# Patient Record
Sex: Female | Born: 1960
Health system: Southern US, Community
[De-identification: ages and names within clinical notes are randomized; demographics above are authoritative.]

## PROBLEM LIST (undated history)

## (undated) DIAGNOSIS — E785 Hyperlipidemia, unspecified: Secondary | ICD-10-CM

## (undated) DIAGNOSIS — Z800 Family history of malignant neoplasm of digestive organs: Secondary | ICD-10-CM

## (undated) DIAGNOSIS — Z808 Family history of malignant neoplasm of other organs or systems: Secondary | ICD-10-CM

## (undated) DIAGNOSIS — Z8041 Family history of malignant neoplasm of ovary: Secondary | ICD-10-CM

## (undated) DIAGNOSIS — Z801 Family history of malignant neoplasm of trachea, bronchus and lung: Secondary | ICD-10-CM

## (undated) DIAGNOSIS — Z86006 Personal history of melanoma in-situ: Secondary | ICD-10-CM

## (undated) HISTORY — DX: Hyperlipidemia, unspecified: E78.5

## (undated) HISTORY — DX: Family history of malignant neoplasm of digestive organs: Z80.0

## (undated) HISTORY — DX: Family history of malignant neoplasm of trachea, bronchus and lung: Z80.1

## (undated) HISTORY — DX: Personal history of melanoma in-situ: Z86.006

## (undated) HISTORY — DX: Family history of malignant neoplasm of other organs or systems: Z80.8

## (undated) HISTORY — PX: LAPAROSCOPIC SALPINGO OOPHERECTOMY: SHX5927

## (undated) HISTORY — DX: Family history of malignant neoplasm of ovary: Z80.41

## (undated) HISTORY — PX: ABDOMINAL HYSTERECTOMY: SHX81

---

## 1998-07-13 ENCOUNTER — Ambulatory Visit (HOSPITAL_COMMUNITY): Admission: RE | Admit: 1998-07-13 | Discharge: 1998-07-13 | Payer: Self-pay | Admitting: Endocrinology

## 1998-07-14 ENCOUNTER — Encounter: Payer: Self-pay | Admitting: Endocrinology

## 2000-03-07 ENCOUNTER — Other Ambulatory Visit: Admission: RE | Admit: 2000-03-07 | Discharge: 2000-03-07 | Payer: Self-pay | Admitting: *Deleted

## 2001-04-10 ENCOUNTER — Other Ambulatory Visit: Admission: RE | Admit: 2001-04-10 | Discharge: 2001-04-10 | Payer: Self-pay | Admitting: *Deleted

## 2005-01-27 ENCOUNTER — Encounter: Admission: RE | Admit: 2005-01-27 | Discharge: 2005-01-27 | Payer: Self-pay | Admitting: *Deleted

## 2005-02-18 ENCOUNTER — Encounter (INDEPENDENT_AMBULATORY_CARE_PROVIDER_SITE_OTHER): Payer: Self-pay | Admitting: Specialist

## 2005-02-18 ENCOUNTER — Encounter (INDEPENDENT_AMBULATORY_CARE_PROVIDER_SITE_OTHER): Payer: Self-pay | Admitting: *Deleted

## 2005-02-18 ENCOUNTER — Inpatient Hospital Stay (HOSPITAL_COMMUNITY): Admission: RE | Admit: 2005-02-18 | Discharge: 2005-02-21 | Payer: Self-pay | Admitting: *Deleted

## 2005-04-15 ENCOUNTER — Ambulatory Visit (HOSPITAL_COMMUNITY): Admission: RE | Admit: 2005-04-15 | Discharge: 2005-04-15 | Payer: Self-pay | Admitting: Urology

## 2008-08-26 ENCOUNTER — Ambulatory Visit: Payer: Self-pay | Admitting: Family Medicine

## 2008-08-26 ENCOUNTER — Encounter: Payer: Self-pay | Admitting: Sports Medicine

## 2008-08-26 DIAGNOSIS — M7750 Other enthesopathy of unspecified foot: Secondary | ICD-10-CM | POA: Insufficient documentation

## 2008-08-26 DIAGNOSIS — M2140 Flat foot [pes planus] (acquired), unspecified foot: Secondary | ICD-10-CM | POA: Insufficient documentation

## 2008-08-26 DIAGNOSIS — G5750 Tarsal tunnel syndrome, unspecified lower limb: Secondary | ICD-10-CM | POA: Insufficient documentation

## 2008-08-26 DIAGNOSIS — M21619 Bunion of unspecified foot: Secondary | ICD-10-CM | POA: Insufficient documentation

## 2008-08-26 DIAGNOSIS — M722 Plantar fascial fibromatosis: Secondary | ICD-10-CM | POA: Insufficient documentation

## 2008-08-29 ENCOUNTER — Encounter: Payer: Self-pay | Admitting: Sports Medicine

## 2008-09-16 ENCOUNTER — Ambulatory Visit: Payer: Self-pay | Admitting: Sports Medicine

## 2008-12-09 ENCOUNTER — Ambulatory Visit: Payer: Self-pay | Admitting: Family Medicine

## 2010-05-25 NOTE — Op Note (Signed)
NAME:  Amanda Carney, Amanda Carney               ACCOUNT NO.:  [id number]   MEDICAL RECORD NO.:  [id number]          PATIENT TYPE:  INP   LOCATION:  9373                          FACILITY:  [facility]   PHYSICIAN:  Pershing Cox, M.D.DATE OF BIRTH:  1960-01-25   DATE OF PROCEDURE:  02/18/2005  DATE OF DISCHARGE:                                 OPERATIVE REPORT   PREOPERATIVE DIAGNOSIS:  Large complex ovarian cyst in a patient with a  history of ovarian transposition and pelvic radiation therapy for treatment  of a squamous carcinoma of the cervix.   POSTOPERATIVE DIAGNOSIS:  Benign serous cyst of both ovaries.  Extensive  adhesive disease of the left ovary to the retroperitoneal structures and  linear ureterotomy the left ureter.   SURGEON:  Pershing Cox, M.D.   ASSISTANT:  Richardean Sale, M.D.   INTRAOPERATIVE UROLOGY CONSULT:  Leighton Roach McDiarmid, M.D.   INDICATIONS FOR PROCEDURE:  This patient is 50 years old.  She has a family  history of ovarian cancer and has been followed in my office with sonograms  because of her history.  She was seen this year and had a sonogram which  showed complex ovarian cyst.  The sonogram was repeated, and the cyst had  persisted.  A decision was made to remove the cyst by exploratory  laparotomy.  What is important to know is that she had a history of cervical  cancer and underwent radical hysterectomy in 1993.  At that time, both of  her ovaries were removed out of the pelvis, and she subsequently received a  4500 rads of whole pelvic radiation therapy with four field technique.   OPERATIVE FINDINGS:  The omentum was adherent to the previous midline  incision but it was not densely adherent.  There was no evidence of small  bowel adhesive disease or small bowel damage in the pelvis.  The left ureter  was approximately 3 mm to 4 mm in size.  This was the size of the ureter,  virtually the length of its transverse through the pelvis.  The 8 cm ovarian  cyst was arising in the mesentery of the descending colon.  The ovary was  adherent to the psoas muscle, the left external iliac artery and vein into  the ureter.  There were absolutely no surgical planes seen between the ovary  and the structures.  Extensive adhesive disease in this area required a  careful dissection which lasted approximately 2 hours just in order to be  certain that we did not injure the external iliac artery, vein or ureter.  During the dissection, the ovary was ruptured, and then we used the ovary  with a clamp to carefully see the extent of the ovary and its adhesions as  we conducted our dissection from the lower structures.  The ovary was sent  for frozen section and returned as a serous cyst of the ovary.  During the  dissection, there was no clear evidence as to where the infundibulopelvic  ligament came into the ovary.  Later in the case inspecting that side of the  pelvis,  I saw a pedicle which seemed to contain a small vascular structure.  It is my strong suspicion that this was filleted over the ovarian cyst and  once the cyst was removed it came back to normal size and was the vascular  pedicle.  At the end of the dissection of the left ovary, I was inspecting  the surfaces and there appeared to be a small injury in the left ureter  along the cervix that had been dissected from the ovary.  This was about 1  cm in length and was sort of a linear or serosal tear.  Because the patient  had previous radiation therapy, I was concerned about this and we  administered indigo carmine and there was evidence of indigo carmine cells.  The urologist was called and later in the case r. McDiarmid came and placed  the stent and sutured over the top of the stent to repair this ureter.  Please refer to his own dictated note for more details.   The right ovary was high, almost beside the right kidney.  It appeared to be  more like a mesenteric inclusion cyst and the adhesions  to this and ovary  were soft and filamentous. The IP ligament was clamped and free tied and  ligated after being cut.  There was no bleeding after this ovary was  removed.  After Dr. McDiarmid finished his repair, we irrigated the  retroperitoneal space and there was a pinpoint hole in the external iliac  vein. This was controlled with two hemoclips and 5 minutes of pressure.  Careful inspection of this area after the dissection shows no evidence of  bleeding.  After the completion of Dr. McDiarmid's TURP, the omentum was  brought down into the base and layered over the site of the ureterotomy and  the dissection.  Prior to this, small bowel was run, and there was no  evidence of adhesive disease.  There was a branch in the omentum which was  suture closed with 0 Vicryl suture in two interrupted stitches.  Again, the  omentum was layered down into the retroperitoneal space.  The site of the  dissection was drained with a JP drain which exited the left lower quadrant.  Please see Dr. McDiarmid's note regarding this drain.   The peritoneal cavity was irrigated with warm saline.  At the beginning of  the procedure, we had instilled 500 mL of warm saline and retracted 400 of  those for peritoneal washings. These were sent as separate specimens at the  beginning of the procedure.  Fascial edges were brought together with 0 PDS  using a looped suture and sewing to the middle of the incision from each  end.  The sutures were tied and then buried with 0 Vicryl suture to keep  them from sticking out.  Skin edges were brought together over these Vicryl  sutures with straight skin staples.   The patient received 3000 mL of crystalloid.  She had 250 mL of urine and  550 mL of blood loss.      Pershing Cox, M.D.  Electronically Signed     MAJ/MEDQ  D:  02/18/2005  T:  02/18/2005  Job:  [id number]   cc:   Richardean Sale, M.D.  Fax: [phone]   Leighton Roach McDiarmid, M.D. Fax: [phone]

## 2010-05-25 NOTE — Op Note (Signed)
NAMEMarland Kitchen  Amanda, Carney               ACCOUNT NO.:  [id number]   MEDICAL RECORD NO.:  [id number]          PATIENT TYPE:  INP   LOCATION:  9399                          FACILITY:  [facility]   PHYSICIAN:  Martina Sinner, MD DATE OF BIRTH:  1960-09-11   DATE OF PROCEDURE:  02/18/2005  DATE OF DISCHARGE:                                 OPERATIVE REPORT   PREOPERATIVE DIAGNOSIS:  Injury left ureter.   POSTOPERATIVE DIAGNOSIS:  Injury left ureter.   OPERATION/PROCEDURE:  1.  Repair of ureterotomy.  2.  Insertion of stent.   SURGEON:  Leighton Roach McDiarmid, M.D.   ASSISTANT:  Pershing Cox, M.D.   INDICATIONS:  Dr. Carey Bullocks consulted me intraoperatively while she was doing  a hysterectomy on a patient who required her ovaries to be removed. She has  had previous pelvic radiation and hysterectomy.  Apparently the ovary was  quite close to the ureter and large retroperitoneal vessels.  She noticed  approximately 1 cm surgical laceration to the left ureter just distal to the  bifurcation of the iliacs in the high left pelvis.  There is no history of  previous renal disease.  She had given indigo carmine intraoperatively and  blue dye could be easily seen exiting the small, short laceration.   DESCRIPTION OF PROCEDURE:  I initially inspected the ureter.  It was well  mobilized.  Vascularization looked good except for short segment near the  laceration.  Having said that, the ureteral lumen was intact and for a  patient who has had previous radiation, it actually looked very healthy.  It  did have peristalsis and some blue dye exiting the short laceration.   Initially I tried to place a glide wire through the opening down into the  bladder but the ureter was quite mobile and of small lumen and because of  the J-shape of the wire, it would not easily pass.  I then asked for a  straight sensor wire which I could easily place through the ureterotomy down  into the bladder.  I could palpate the  wire all the way down and beyond the  ureteral vesical junction on the left side.  I then passed the proximal end  of a 6-French x 24 cm double-J stent, well-lubricated  over the wire into  the bladder.  I could feel the stent within the bladder as well as the  ureter at the level of the ureteral vesical junction.  I also could feel the  Foley balloon.   I then removed the wire and Dr. Carey Bullocks held the stent in place with  [name].  I then placed the wire through one of its side holes and had this  enter and exit through the distal end of the double-J stent.  The wire was  then placed up into the left renal pelvis.  I could feel resistance.  I then  passed the double-J wire fairly easily up into the left renal pelvis.  I was  very happy with the placement of the stent.  The laceration may have been  made a few millimeters  larger with the maneuver due to the memory of the  stent and/or sensory wire.  The stent was laying freely within the ureter.  The ureter was closed with four interrupted 4-0 Vicryl sutures.  There was  no efflux of blue dye throughout the rest of the case into the  retroperitoneum.  A small bleeding point on the external iliac vein was  stopped with direct pressure by Dr. Carey Bullocks.  I placed a 10-French Blake  drain in the retroperitoneum approximately two inches away from the hole.  I  placed omentum in the area of the ureter.  I did not suture in places that  laid in so freely.  I did not want the drain to directly lie over the  repaired ureter.   I inspected the ureter again and saw no other openings.  The ureter was  peristalsing nicely around the stent.  I could palpate the stent all the way  down through the ureteral vesical junction as well as o\in the bladder.  I  could feel it between my fingertips as well as feel the Foley balloon.   A 2-0 silk suture was used to suture in the drain.  The patient will be  followed as per protocol.  The patient could be kept  on ciprofloxacin for at  least a week.  The patient will have an indwelling stent for approximately  six weeks.  She is at some risk of a stricture, especially due to radiation,  but actually the intraoperative findings the ureter looked quite healthy.           ______________________________  Martina Sinner, MD  Electronically Signed     SAM/MEDQ  D:  02/18/2005  T:  02/19/2005  Job:  [id number]

## 2010-05-25 NOTE — H&P (Signed)
NAME:  Amanda Carney, Amanda Carney               ACCOUNT NO.:  [id number]   MEDICAL RECORD NO.:  [id number]          PATIENT TYPE:  INP   LOCATION:  NA                            FACILITY:  [facility]   PHYSICIAN:  Pershing Cox, M.D.DATE OF BIRTH:  Nov 07, 1960   DATE OF ADMISSION:  02/18/2005  DATE OF DISCHARGE:                                HISTORY & PHYSICAL   PREOPERATIVE DIAGNOSIS:  Cystic ovarian masses bilaterally and ovaries which  had been previously transposed out of the pelvis.   HISTORY OF PRESENT ILLNESS:  Amanda Carney is a 50 year old, gravida 0  female who is single.  She works for [employer].  She has had a history  of cervical cancer in the past and underwent radical hysterectomy for this  in the 1993.  At that time both of her ovaries were moved out of the pelvis  and she subsequently received 4500 rads of whole pelvic radiation therapy  for a moderately differentiated tumor.  She has a family history of ovarian  cancer and we have been following cysts and her transposed ovaries for  approximately six months.  Recently when she returned for evaluation, the  cysts were still present.  She was counseled by Dr. Earlene Plater regarding this and  it was recommended that these ovaries be removed.  CA125 is negative.  It is  important to know that her mother is in her third recurrence from ovarian  cancer.  An MRI has confirmed the areas of concern on her ovaries.   ALLERGIES:  None known.   PAST MEDICAL HISTORY:  1.  Stage IA moderately differentiated squamous cell carcinoma of the      cervix.  2.  History of Graves disease.  3.  History of melanoma.   PAST SURGICAL HISTORY:  Radical hysterectomy with appendectomy and ovarian  cystectomy and ovarian transposition in 1993.   MEDICATIONS:  1.  Vivelle-Dot 0.05 mg changed biweekly.  2.  Allegra 60 mg capsule daily.  3.  Black cohosh daily.   SOCIAL HISTORY:  The patient is single.  She works for [employer].  She  exercises and  plays tennis six to seven times a week.  She does not smoke.  She uses alcohol approximately four times a week and caffeine twice daily.  She wears a seat belt and has never been a victim of domestic violence.   FAMILY HISTORY:  The patient's father has had a history of malignancy.  Her  mother is 16 and has recurrent ovarian cancer, cared for by Dr. Marlane Hatcher.  Her mother also has a diagnosis of osteoporosis.  She has two sisters; one  with multiple sclerosis and one who has diabetes, poorly controlled and has  had a myocardial infarction this year.   REVIEW OF SYSTEMS:  The patient's only medical concern is anxiety associated  with this new diagnosis and concern that this may be what her mother has  experienced.  She has had some bloating and abdominal pain but wonders if  this could just be a nervous reaction.   PHYSICAL EXAMINATION:  VITAL SIGNS:  Blood  pressure 102/66, pulse 68 and  regular, weight 131, height 5 feet 2 inches, BMI 23.99.  GENERAL:  Well-developed, well-nourished Caucasian female in no acute  distress.  HEENT:  Normocephalic, atraumatic.  Anicteric.  EOMI.  PERRLA.  NECK:  Supple.  Thyroid is normal to palpation.  Carotid arteries are  without bruit bilaterally.  LUNGS:  Clear to auscultation and percussion.  BACK:  No CVA or spinal tenderness.  BREASTS:  Symmetric bilaterally.  No palpable masses.  CARDIOVASCULAR:  Regular rate and rhythm without murmur.  ABDOMEN:  Laparotomy scar in the midline consistent with previous radical  hysterectomy.  No hepatosplenomegaly.  No hernia.  PELVIC:  Normal external genitalia.  The vulva and Bartholin's glands are  normal.  Cervix and uterus are surgically absent.  The patient's ovaries  cannot be palpated.  NEURO/PSYCHIATRIC:  The patient's mood and affect are mildly anxious but  otherwise normal.  MUSCULOSKELETAL:  Gait and station are normal.  LYMPHATICS:  No supraclavicular, axillary, or inguinal adenopathy.    LABORATORY DATA:  Sonogram and MRI show bilateral cystic masses with __a  few________ septations running through the ovarian masses.  On the left  there is a nodule associated with the cystic area.   ASSESSMENT:  1.  Bilateral cystic masses in a patient with transposed ovaries and a      previous history of pelvic radiation therapy following radical      hysterectomy.  2.  Maternal history of ovarian cancer.   PLAN:  Exploratory laparotomy with bilateral salpingo-oophorectomy.  Frozen  section will be performed.  If malignancy is found, lymph nodes above the  previous area of lymph node dissection will be sampled if possible.      Pershing Cox, M.D.  Electronically Signed     MAJ/MEDQ  D:  02/02/2005  T:  02/02/2005  Job:  [id number]

## 2010-05-25 NOTE — Discharge Summary (Signed)
NAMEMarland Kitchen  Carney, Amanda               ACCOUNT NO.:  [id number]   MEDICAL RECORD NO.:  [id number]          PATIENT TYPE:  INP   LOCATION:  9317                          [facility]ACILITY:  [facility]   PHYSICIAN:  Pershing Cox, M.D.DATE OF BIRTH:  Sep 13, 1960   DATE OF ADMISSION:  02/18/2005  DATE OF DISCHARGE:  02/21/2005                                 DISCHARGE SUMMARY   ADMITTING DIAGNOSES:  1.  Bilateral ovarian cysts and transposed ovaries.  2.  Family history of ovarian cancer.   For details of the patient's admission history and physical please see the  note dated February 18, 2005. Briefly, this 50 year old female has a history  of squamous cell carcinoma of the cervix, underwent radical hysterectomy in  1993 with transposition of both of her ovaries out of the pelvis. She  subsequently received 45,000 rads of pelvic radiation therapy. She has a  family history of ovarian cancer and we have been following the transposed  ovaries with abdominal sonogram. She has began to develop cystic lesions on  her ovaries and presents today for a bilateral salpingo-oophorectomy. Her CA-  125 is not elevated.   HOSPITAL COURSE:  The patient was taken to the operating room on the day of  admission. She underwent exploratory laparotomy with a midline incision  through her previous midline incision for her radical hysterectomy. The  level of adhesive disease was extensive and most important, the left ovary  was densely adherent to the retroperitoneum. All of the retroperitoneal  vessels and ureter were densely adherent. The ureter on the left side was  very small, about the size of a piece of spaghetti. Dissection of the ovary  from this area resulted in a serosal tear in this area and indigo carmine  leaked from this site. This was repaired by Dr. Sherron Monday with a linear  repair after placement of a double-J stent through a ureterotomy.   Estimated blood loss intraoperatively was about 550 mL. There  were no other  intraoperative complications. Frozen section diagnosis on both ovaries  showed them to be benign.   The patient was admitted to the intensive care unit after surgery. A drain  had been placed in her retroperitoneum and we were concerned of the  possibility of leaking urine and that her urine output would be followed  very closely. This was the only reason for her ICU admission. On the evening  of postoperative day #1, she was very drowsy but stable. Hemoglobin was  12.1. She was encouraged to use the inspirometer, and on the morning of  postoperative day #1 was able to be transferred to a bed on the floor. Vital  signs were stable, O2 saturations were acceptable. Urine output was  acceptable and the JP output was only 20 mL since admission. Hemoglobin was  10.5. The patient was begun on sips of water and advanced to clear liquids  on this day. She was also followed by Dr. Sherron Monday in the postoperative  period. He ordered a KUB to check the stent and the Foley was left in until  the day of discharge. On  postoperative day #2, the patient was complaining  of gas pain. She had not had any flatus. T-max had been 99.4 but she was  afebrile on rounds. She was started on Toradol for her pain which produced  excellent results. It should be noted that the patient had been treated with  ciprofloxacin in the perioperative period and this was continued orally in  the postoperative period. On postoperative day #2, there was increased  drainage from the retroperitoneal drain. This was sent for creatinine and  was not suggestive of a ureteral leak as the creatinine was normal. The  patient passed flatus on the evening of postoperative day #2, then  complained of hunger. Her diet was advanced, she was afebrile. The stent was  correctly placed on x-ray. On the morning of postoperative day #3, the  patient had a repeat hemoglobin which was 8.7. She had no changes in her  vital signs. Her  urine output was excellent. The JP drain was removed on  this day. The patient was able to have her Foley catheter removed and was  able to be discharged on the morning or early afternoon of February 11, 2005.   Pathology showed benign serous cystadenoma of the left ovary and benign  serous cystadenoma of the right ovary with a hydrosalpinx. Peritoneal  washings were negative.   FINAL DIAGNOSIS:  Ovarian cyst with extensive adhesive disease due to pelvic  radiation therapy with repair of a left ureterotomy.      Pershing Cox, M.D.  Electronically Signed     MAJ/MEDQ  D:  03/08/2005  T:  03/08/2005  Job:  [id number]   cc:   Martina Sinner, MD  Fax: [phone]

## 2013-08-20 ENCOUNTER — Ambulatory Visit (INDEPENDENT_AMBULATORY_CARE_PROVIDER_SITE_OTHER): Payer: BC Managed Care – PPO | Admitting: Podiatrist

## 2013-08-20 ENCOUNTER — Encounter: Payer: Self-pay | Admitting: Podiatrist

## 2013-08-20 VITALS — BP 131/78 | HR 61 | Resp 13 | Ht 62.5 in | Wt 130.0 lb

## 2013-08-20 DIAGNOSIS — Q6650 Congenital pes planus, unspecified foot: Secondary | ICD-10-CM

## 2013-08-20 DIAGNOSIS — M722 Plantar fascial fibromatosis: Secondary | ICD-10-CM

## 2013-08-20 NOTE — Progress Notes (Signed)
   Subjective:    Patient ID: Amanda Carney, female    DOB: 11-Nov-1960, 53 y.o.   MRN: [id number]  HPI Comments: Pt states her orthotics are worn out and request new.  Pt state she was treated for plantar fasciitis, but currently does not have any problem.     Review of Systems  HENT: Positive for congestion and sinus pressure.   All other systems reviewed and are negative.      Objective:   Physical Exam  Patient is awake, alert, and oriented x 3.  In no acute distress.  Vascular status is intact with palpable pedal pulses at 2/4 DP and PT bilateral and capillary refill time within normal limits. Neurological sensation is also intact bilaterally via Semmes Weinstein monofilament at 5/5 sites. Light touch, vibratory sensation, Achilles tendon reflex is intact. Dermatological exam reveals skin color, turger and texture as normal. No open lesions present.  Musculature intact with dorsiflexion, plantarflexion, inversion, eversion. Moderate pes planus deformity is present bilateral.  Mild plantar fasciitis symptomatology present bilateral. Overall significantly improved since wearing the orthotics. The orthotics are well worn out and a new pair is needed.    Assessment & Plan:  Plantar fasciitis  Plan: I do recommend getting her a a new pair of orthotics. She was scanned at today's visit. I am prescribing the 3-D with a lower heel seat and less bulk in hopes that it will fit in her tennis sport shoes well. 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 I will see her when these are ready for pick up and we will make sure they fit well.

## 2013-09-10 ENCOUNTER — Ambulatory Visit: Payer: BC Managed Care – PPO

## 2013-09-10 DIAGNOSIS — M722 Plantar fascial fibromatosis: Secondary | ICD-10-CM

## 2013-09-10 NOTE — Progress Notes (Signed)
Pt is here to PUO 

## 2013-09-10 NOTE — Patient Instructions (Signed)

## 2013-11-05 ENCOUNTER — Encounter: Payer: Self-pay | Admitting: Podiatrist

## 2013-11-05 ENCOUNTER — Ambulatory Visit (INDEPENDENT_AMBULATORY_CARE_PROVIDER_SITE_OTHER): Payer: BC Managed Care – PPO | Admitting: Podiatrist

## 2013-11-05 VITALS — BP 130/70 | HR 58 | Resp 13

## 2013-11-05 DIAGNOSIS — M722 Plantar fascial fibromatosis: Secondary | ICD-10-CM

## 2013-11-05 DIAGNOSIS — Q6650 Congenital pes planus, unspecified foot: Secondary | ICD-10-CM

## 2013-11-09 NOTE — Progress Notes (Signed)
Subjective: Amanda Carney presents today for orthotic recheck. She states that the heel of the orthotics is a bit too thick   in the forefoot is uncomfortable.  Objective:orthotics are built up in the heel. We will have these adjusted. I may add some forefoot padding when the orthotics her back in to offload the forefoot. We will contact her with the orthotics are ready for pickup.

## 2013-12-24 ENCOUNTER — Encounter: Payer: Self-pay | Admitting: Podiatrist

## 2015-03-31 ENCOUNTER — Ambulatory Visit: Payer: Self-pay | Admitting: Surgery

## 2016-01-10 DIAGNOSIS — E789 Disorder of lipoprotein metabolism, unspecified: Secondary | ICD-10-CM | POA: Diagnosis not present

## 2016-01-10 DIAGNOSIS — R05 Cough: Secondary | ICD-10-CM | POA: Diagnosis not present

## 2016-04-17 DIAGNOSIS — M1990 Unspecified osteoarthritis, unspecified site: Secondary | ICD-10-CM | POA: Diagnosis not present

## 2016-04-17 DIAGNOSIS — E789 Disorder of lipoprotein metabolism, unspecified: Secondary | ICD-10-CM | POA: Diagnosis not present

## 2016-04-24 DIAGNOSIS — N952 Postmenopausal atrophic vaginitis: Secondary | ICD-10-CM | POA: Diagnosis not present

## 2016-04-24 DIAGNOSIS — E789 Disorder of lipoprotein metabolism, unspecified: Secondary | ICD-10-CM | POA: Diagnosis not present

## 2016-04-24 DIAGNOSIS — M25569 Pain in unspecified knee: Secondary | ICD-10-CM | POA: Diagnosis not present

## 2016-04-24 DIAGNOSIS — Z01419 Encounter for gynecological examination (general) (routine) without abnormal findings: Secondary | ICD-10-CM | POA: Diagnosis not present

## 2016-05-07 DIAGNOSIS — Z1231 Encounter for screening mammogram for malignant neoplasm of breast: Secondary | ICD-10-CM | POA: Diagnosis not present

## 2016-06-10 ENCOUNTER — Ambulatory Visit (INDEPENDENT_AMBULATORY_CARE_PROVIDER_SITE_OTHER): Payer: 59 | Admitting: Sports Medicine

## 2016-06-10 ENCOUNTER — Encounter: Payer: Self-pay | Admitting: Sports Medicine

## 2016-06-10 ENCOUNTER — Ambulatory Visit
Admission: RE | Admit: 2016-06-10 | Discharge: 2016-06-10 | Disposition: A | Discharge status: Home / Self Care | Payer: 59 | Source: Ambulatory Visit | Attending: Sports Medicine | Admitting: Sports Medicine

## 2016-06-10 VITALS — BP 130/86 | Ht 62.5 in | Wt 139.0 lb

## 2016-06-10 DIAGNOSIS — M7989 Other specified soft tissue disorders: Secondary | ICD-10-CM | POA: Diagnosis not present

## 2016-06-10 DIAGNOSIS — M25562 Pain in left knee: Principal | ICD-10-CM

## 2016-06-10 DIAGNOSIS — G8929 Other chronic pain: Secondary | ICD-10-CM | POA: Diagnosis not present

## 2016-06-10 MED ORDER — MELOXICAM 15 MG PO TABS: ORAL_TABLET | ORAL | 0 refills | Status: DC

## 2016-06-10 NOTE — Progress Notes (Signed)
   Subjective:    Amanda Carney - 56 y.o. female MRN [id number]  Date of birth: Jun 05, 1960  CC: Bilateral knee pain (L>R)  HPI: Amanda Carney is a  56 y/o female presenting with bilateral knee pain (L>R). She started noticing "discomfort" in her knees back in December and it has worsened since then especially while lying supine and with activity. She used to play tennis and do aerobics about 5 times a week which she has now reduced to only 3 times a week due to pain. Endorses pain upon standing and while applying torque to her knees. She currently treats her pain with OTC ibuprofen prn. Has not used ice on her knees for symptomatic relief. She had a fall back in December as well that resulted in right knee/leg muscle soreness which has since resolved completely. Denies any other trauma to her knees. Denies locking, popping, radiation, numbness, or tingling.  ROS: Denies fevers, chills, or weight loss. Negative except per HPI.  PMH: Plantar fascitis SH: Occasional alcohol use; denies tobacco use  Objective:   Physical Exam BP 130/86   Ht 5' 2.5" (1.588 m)   Wt 139 lb (63 kg)   BMI 25.02 kg/m  Gen: NAD, alert, cooperative with exam, well-appearing Skin: no rashes, normal turgor   Psych: good insight, alert and oriented L. Knee: No rashes or erythema;  trace joint effusion; TTP over medial inferior joint line; FROM upon flexion, rotation, and extension; positive McMurray's test; Strength 5/5; +2 patellar reflex; NV intact distally R. Knee: No rashes or erythema; no swelling or effusion; crepitus noted with knee extension; FROM upon flexion, rotation, and extension; negative McMurray's test; Strength 5/5; +2 patellar reflex; NV intact distally    Assessment & Plan:  Amanda Carney is a  56 y/o female with bilateral knee pain (L>R).  Degenerative disease vs. Meniscus injury The chronicity of her pain with high impact activities along with bilateral symptoms and joint effusion are  suggestive of degenerative changes to the knee joint. Will obtain knee X-rays to evaluate her for arthritis. She also has pain while applying torque and a positive McMurray's sign on left knee exam which may indicate meniscus injury as well. We spoke about performing a knee U/S to further evaluate the knee joints in two weeks after she gets the X-ray. She was also TTP on the inferior medial aspect of her knee which may indicate a pes anserine bursitis pathology and will evaluate that as well with U/S in two weeks. In the meantime, I recommend she perform knee stabilizing exercises at home along with the use of a compression sleeve. I would also recommend scheduled NSAIDS rather than prn use for symptomatic relief. Will reassess her in two weeks.  -Bilateral knee X-rays -Meloxicam 15mg  qd with food for 7 days -Use compression sleeve -Knee exercises -F/u in 2 weeks; reassess with U/S exam  I personally was present and performed or re-performed the history, physical exam and medical decision-making activities of this service and have verified that the service and findings are accurately documented in the student's note. Patient's x-rays were reviewed and show advanced medial compartmental DJD. I will discuss these findings with the patient at follow-up and we will also plan on performing an ultrasound at that time. In the meantime, start meloxicam as above, start isometric quad exercises daily, and use a compression sleeve with activity.

## 2016-06-24 ENCOUNTER — Encounter: Payer: Self-pay | Admitting: Sports Medicine

## 2016-06-24 ENCOUNTER — Ambulatory Visit (INDEPENDENT_AMBULATORY_CARE_PROVIDER_SITE_OTHER): Payer: 59 | Admitting: Sports Medicine

## 2016-06-24 VITALS — BP 148/74 | Ht 62.5 in | Wt 138.0 lb

## 2016-06-24 DIAGNOSIS — M1712 Unilateral primary osteoarthritis, left knee: Secondary | ICD-10-CM

## 2016-06-24 NOTE — Progress Notes (Addendum)
  Patient comes in today for follow-up on left knee osteoarthritis. Recent x-rays show advanced medial compartmental DJD. She is still having medial sided knee pain with activity. Meloxicam has not been helpful. Pain is primarily present after activity. No mechanical symptoms.  Physical exam was not repeated but she has some mild medial knee instability and mild varus thrust with standing and walking.  We simply talked about future treatment options. She would like to try a medial unloader brace. She does not want to pursue cortisone injections but she may be interested in Visco supplementation in the future. She understands that definitive treatment is either a unicompartmental replacement or total knee arthroplasty. She is not yet ready to consider that. I would like to see her back in the office 4 weeks after she gets her medial unloader brace. She will contact me in the meantime with questions or concerns.  Total time spent with the patient was 15 minutes with greater that 50% of the time spent in face-to-face consultation discussing her diagnosis and treatment options.

## 2016-07-01 DIAGNOSIS — M1712 Unilateral primary osteoarthritis, left knee: Secondary | ICD-10-CM | POA: Diagnosis not present

## 2016-07-23 DIAGNOSIS — L570 Actinic keratosis: Secondary | ICD-10-CM | POA: Diagnosis not present

## 2016-07-23 DIAGNOSIS — D225 Melanocytic nevi of trunk: Secondary | ICD-10-CM | POA: Diagnosis not present

## 2016-07-23 DIAGNOSIS — L821 Other seborrheic keratosis: Secondary | ICD-10-CM | POA: Diagnosis not present

## 2016-07-23 DIAGNOSIS — L219 Seborrheic dermatitis, unspecified: Secondary | ICD-10-CM | POA: Diagnosis not present

## 2016-08-06 DIAGNOSIS — H43392 Other vitreous opacities, left eye: Secondary | ICD-10-CM | POA: Diagnosis not present

## 2016-08-19 ENCOUNTER — Ambulatory Visit: Payer: 59 | Admitting: Sports Medicine

## 2016-09-10 DIAGNOSIS — M25561 Pain in right knee: Secondary | ICD-10-CM | POA: Diagnosis not present

## 2016-09-10 DIAGNOSIS — M170 Bilateral primary osteoarthritis of knee: Secondary | ICD-10-CM | POA: Diagnosis not present

## 2016-09-10 DIAGNOSIS — M25562 Pain in left knee: Secondary | ICD-10-CM | POA: Diagnosis not present

## 2016-09-24 ENCOUNTER — Ambulatory Visit: Payer: 59 | Admitting: Sports Medicine

## 2016-10-28 DIAGNOSIS — E789 Disorder of lipoprotein metabolism, unspecified: Secondary | ICD-10-CM | POA: Diagnosis not present

## 2016-11-06 DIAGNOSIS — E789 Disorder of lipoprotein metabolism, unspecified: Secondary | ICD-10-CM | POA: Diagnosis not present

## 2016-11-06 DIAGNOSIS — M170 Bilateral primary osteoarthritis of knee: Secondary | ICD-10-CM | POA: Diagnosis not present

## 2016-11-26 ENCOUNTER — Telehealth: Payer: Self-pay

## 2016-11-26 NOTE — Telephone Encounter (Signed)
Will have Ria Comment, Dr. Thompson Caul nurse, call pt to schedule B/L injections with Dr. Tamala Julian.

## 2016-12-18 ENCOUNTER — Ambulatory Visit: Payer: 59 | Admitting: Sports Medicine

## 2016-12-23 ENCOUNTER — Other Ambulatory Visit: Payer: Self-pay | Admitting: *Deleted

## 2016-12-23 DIAGNOSIS — M25562 Pain in left knee: Secondary | ICD-10-CM

## 2016-12-25 ENCOUNTER — Ambulatory Visit (INDEPENDENT_AMBULATORY_CARE_PROVIDER_SITE_OTHER): Payer: 59 | Admitting: Family Medicine

## 2016-12-25 ENCOUNTER — Ambulatory Visit: Payer: Self-pay

## 2016-12-25 ENCOUNTER — Encounter: Payer: Self-pay | Admitting: Family Medicine

## 2016-12-25 VITALS — BP 140/72 | HR 67 | Ht 62.0 in | Wt 141.0 lb

## 2016-12-25 DIAGNOSIS — M25562 Pain in left knee: Secondary | ICD-10-CM

## 2016-12-25 DIAGNOSIS — G8929 Other chronic pain: Secondary | ICD-10-CM

## 2016-12-25 DIAGNOSIS — M170 Bilateral primary osteoarthritis of knee: Secondary | ICD-10-CM | POA: Diagnosis not present

## 2016-12-25 DIAGNOSIS — M25561 Pain in right knee: Secondary | ICD-10-CM | POA: Diagnosis not present

## 2016-12-25 MED ORDER — VITAMIN D (ERGOCALCIFEROL) 1.25 MG (50000 UNIT) PO CAPS: 50000.0000 [IU] | ORAL_CAPSULE | ORAL | 0 refills | Status: DC

## 2016-12-25 NOTE — Progress Notes (Signed)
[name] [address] [place], [place] [address] Phone: [phone] Subjective:    I'm seeing this patient by the request  of:  Dr. Micheline Chapman  CC: Bilateral knee pain IDP:[id number]  Amanda Carney is a 56 y.o. female coming in with complaint of bilateral knee pain. The left is worse than the right. She's been seeing Dr. Jonelle Sports. She has xrays and has tried [employer]. Was in a muscular stimulation study for 3 months. Is looking for an injection bilaterally.  Patient states that she would not like steroid injections.  Patient was looking for the possibility of Visco supplementation.  Onset- A year Location- Medial Duration- Worse at night (comes and goes) Character- Sharp and achy Aggravating factors- Twisting, and tennis Reliving factors- Ice, stretching, massage  Therapies tried-physical therapy, icing regimen, home exercises Severity-8 out of 10 because it is stopping her from playing tennis   Patient does have x-ray showing the patient does have advanced narrowing of the medial compartment bilaterally.  Were independently visualized by me she is scheduled for an MRI of December 27.  Past Medical History:  Diagnosis Date  . Hyperlipidemia    Past Surgical History:  Procedure Laterality Date  . ABDOMINAL HYSTERECTOMY    . LAPAROSCOPIC SALPINGO OOPHERECTOMY     Social History   Socioeconomic History  . Marital status: Married    Spouse name: None  . Number of children: None  . Years of education: None  . Highest education level: None  Social Needs  . Financial resource strain: None  . Food insecurity - worry: None  . Food insecurity - inability: None  . Transportation needs - medical: None  . Transportation needs - non-medical: None  Occupational History  . None  Tobacco Use  . Smoking status: Never Smoker  . Smokeless tobacco: Never Used  Substance and Sexual Activity  . Alcohol use: None  . Drug use: None  . Sexual activity: None    Other Topics Concern  . None  Social History Narrative  . None   No Known Allergies Family History  Problem Relation Age of Onset  . Cancer Mother   . Osteoporosis Mother      Past medical history, social, surgical and family history all reviewed in electronic medical record.  No pertanent information unless stated regarding to the chief complaint.   Review of Systems:Review of systems updated and as accurate as of 12/25/16  No headache, visual changes, nausea, vomiting, diarrhea, constipation, dizziness, abdominal pain, skin rash, fevers, chills, night sweats, weight loss, swollen lymph nodes, body aches,chest pain, shortness of breath, mood changes.  Positive muscle aches and joint swelling  Objective  Blood pressure 140/72, pulse 67, height 5\' 2"  (1.575 m), weight 141 lb (64 kg), SpO2 97 %. Systems examined below as of 12/25/16   General: No apparent distress alert and oriented x3 mood and affect normal, dressed appropriately.  HEENT: Pupils equal, extraocular movements intact  Respiratory: Patient's speak in full sentences and does not appear short of breath  Cardiovascular: No lower extremity edema, non tender, no erythema  Skin: Warm dry intact with no signs of infection or rash on extremities or on axial skeleton.  Abdomen: Soft nontender  Neuro: Cranial nerves II through XII are intact, neurovascularly intact in all extremities with 2+ DTRs and 2+ pulses.  Lymph: No lymphadenopathy of posterior or anterior cervical chain or axillae bilaterally.  Gait normal with good balance and coordination.  MSK:  Non tender with  full range of motion and good stability and symmetric strength and tone of shoulders, elbows, wrist, hip, and ankles bilaterally.  Knee: Bilateral valgus deformity noted.  Abnormal thigh to calf ratio.  Tender to palpation over medial line.  ROM full in flexion and extension and lower leg rotation. Significant instability noted medially Mild painful patellar  compression. Patellar glide with moderate crepitus. Patellar and quadriceps tendons unremarkable. Hamstring and quadriceps strength is normal.       Impression and Recommendations:     This case required medical decision making of moderate complexity.      Note: This dictation was prepared with Dragon dictation along with smaller phrase technology. Any transcriptional errors that result from this process are unintentional.

## 2016-12-25 NOTE — Patient Instructions (Addendum)
Good to see you  Ice 20 minutes 2 times daily. Usually after activity and before bed. pennsaid pinkie amount topically 2 times daily as needed.  Over the counter turmeric 500mg  twice daily  Tart cherry extract 1200mg  at night Vitamin D once weekly for 12 weeks See me again in 3 weeks.  Happy New Year!

## 2016-12-25 NOTE — Assessment & Plan Note (Signed)
Patient's arthritis all seems to be medial compartment bilaterally.  Patient does have significant pain over the medial joint line that some of this will be possibly able to be taken care of conservatively.  Patient given a trial of topical anti-inflammatories, encourage patient to wear the medial unloader brace on a more regular basis.  We discussed the possibility of Visco supplementation but we also discussed needing a potential trial for the steroid injection for insurance purposes.  Patient has done formal physical therapy at all other conservative therapy.  We discussed at great length about the different types of steroids and the different types of Visco supplementation.  Patient is going to check with insurance company.  Depending on this patient will call back.  We did discuss other over-the-counter natural supplementations that could be some benefit.  Patient is in agreement with plan and will come back in 4 weeks

## 2017-01-02 ENCOUNTER — Ambulatory Visit
Admission: RE | Admit: 2017-01-02 | Discharge: 2017-01-02 | Disposition: A | Discharge status: Home / Self Care | Payer: 59 | Source: Ambulatory Visit | Attending: Sports Medicine | Admitting: Sports Medicine

## 2017-01-02 DIAGNOSIS — M25562 Pain in left knee: Secondary | ICD-10-CM

## 2017-01-02 DIAGNOSIS — M25462 Effusion, left knee: Secondary | ICD-10-CM | POA: Diagnosis not present

## 2017-01-16 ENCOUNTER — Ambulatory Visit (INDEPENDENT_AMBULATORY_CARE_PROVIDER_SITE_OTHER): Payer: 59 | Admitting: Family Medicine

## 2017-01-16 ENCOUNTER — Encounter: Payer: Self-pay | Admitting: Family Medicine

## 2017-01-16 DIAGNOSIS — M170 Bilateral primary osteoarthritis of knee: Secondary | ICD-10-CM

## 2017-01-16 NOTE — Assessment & Plan Note (Signed)
Bilateral injection given.  We discussed icing regimen and home exercises.  We discussed which activities of doing which wants to avoid.  Patient will start to increase activity as tolerated.  Could be a candidate for Visco supplementation will see if we can get prior authorization.  Follow-up again in 4 weeks

## 2017-01-16 NOTE — Patient Instructions (Signed)
Good to see you  Injected both knees today  Ice is your friend. Ice 20 minutes 2 times daily. Usually after activity and before bed. pennsaid pinkie amount topically 2 times daily as needed.  Continue the vitamins See me again in 4 weeks and lets see how you are doing

## 2017-01-16 NOTE — Progress Notes (Signed)
[name] [place], [place] [address] Phone: [phone] Subjective:    CC: bilateral knee pain   [id number]  Amanda Carney is a 57 y.o. female coming in with complaint of  Bilateral knee pain. Severe OA of knee. MRI of left knee independently visualized patient did have severe arthritis of the medial compartment.  Does have a ganglion cyst noted of the posterior popliteal area.  Patient is having worsening pain bilaterally.  Affecting daily activities.  Difficult to walk.     Past Medical History:  Diagnosis Date  . Hyperlipidemia    Past Surgical History:  Procedure Laterality Date  . ABDOMINAL HYSTERECTOMY    . LAPAROSCOPIC SALPINGO OOPHERECTOMY     Social History   Socioeconomic History  . Marital status: Married    Spouse name: None  . Number of children: None  . Years of education: None  . Highest education level: None  Social Needs  . Financial resource strain: None  . Food insecurity - worry: None  . Food insecurity - inability: None  . Transportation needs - medical: None  . Transportation needs - non-medical: None  Occupational History  . None  Tobacco Use  . Smoking status: Never Smoker  . Smokeless tobacco: Never Used  Substance and Sexual Activity  . Alcohol use: None  . Drug use: None  . Sexual activity: None  Other Topics Concern  . None  Social History Narrative  . None   No Known Allergies Family History  Problem Relation Age of Onset  . Cancer Mother   . Osteoporosis Mother      Past medical history, social, surgical and family history all reviewed in electronic medical record.  No pertanent information unless stated regarding to the chief complaint.   Review of Systems:Review of systems updated and as accurate as of 01/16/17  No headache, visual changes, nausea, vomiting, diarrhea, constipation, dizziness, abdominal pain, skin rash, fevers, chills, night sweats, weight loss, swollen  lymph nodes, body aches,  chest pain, shortness of breath, mood changes.  Positive muscle aches, joint swelling  Objective  Blood pressure 132/82, pulse 71, height 5' 2.5" (1.588 m), weight 137 lb (62.1 kg), SpO2 97 %. Systems examined below as of 01/16/17   General: No apparent distress alert and oriented x3 mood and affect normal, dressed appropriately.  HEENT: Pupils equal, extraocular movements intact  Respiratory: Patient's speak in full sentences and does not appear short of breath  Cardiovascular: No lower extremity edema, non tender, no erythema  Skin: Warm dry intact with no signs of infection or rash on extremities or on axial skeleton.  Abdomen: Soft nontender  Neuro: Cranial nerves II through XII are intact, neurovascularly intact in all extremities with 2+ DTRs and 2+ pulses.  Lymph: No lymphadenopathy of posterior or anterior cervical chain or axillae bilaterally.  Gait normal with good balance and coordination.  MSK:  Non tender with full range of motion and good stability and symmetric strength and tone of shoulders, elbows, wrist, hip, and ankles bilaterally.  Knee: Bilateral valgus deformity noted.  Abnormal thigh to calf ratio.  Tender to palpation over medial and PF joint line.  ROM full in flexion and extension and lower leg rotation. instability with valgus force.  painful patellar compression. Patellar glide with moderate crepitus. Patellar and quadriceps tendons unremarkable. Hamstring and quadriceps strength is normal.  After informed written and verbal consent, patient was seated on exam table. Right knee was prepped with  alcohol swab and utilizing anterolateral approach, patient's right knee space was injected with 4:1  marcaine 0.5%: Kenalog 40mg /dL. Patient tolerated the procedure well without immediate complications.  After informed written and verbal consent, patient was seated on exam table. Left knee was prepped with alcohol swab and utilizing  anterolateral approach, patient's left knee space was injected with 4:1  marcaine 0.5%: Kenalog 40mg /dL. Patient tolerated the procedure well without immediate complications.   Impression and Recommendations:     This case required medical decision making of moderate complexity.      Note: This dictation was prepared with Dragon dictation along with smaller phrase technology. Any transcriptional errors that result from this process are unintentional.

## 2017-01-16 NOTE — Progress Notes (Signed)
[name] [address] [place], [place] [address] Phone: [phone] Subjective:    I'm seeing this patient by the request  of:    CC:   [id number]  Amanda Carney is a 57 y.o. female coming in for follow up for bilateral knee pain. She has seen a slight difference since last visit and she continues to take th supplements. She is still having troubles from sit to standing. She plays tennis and has a hard time performing lateral movements.      Past Medical History:  Diagnosis Date  . Hyperlipidemia    Past Surgical History:  Procedure Laterality Date  . ABDOMINAL HYSTERECTOMY    . LAPAROSCOPIC SALPINGO OOPHERECTOMY     Social History   Socioeconomic History  . Marital status: Married    Spouse name: Not on file  . Number of children: Not on file  . Years of education: Not on file  . Highest education level: Not on file  Social Needs  . Financial resource strain: Not on file  . Food insecurity - worry: Not on file  . Food insecurity - inability: Not on file  . Transportation needs - medical: Not on file  . Transportation needs - non-medical: Not on file  Occupational History  . Not on file  Tobacco Use  . Smoking status: Never Smoker  . Smokeless tobacco: Never Used  Substance and Sexual Activity  . Alcohol use: Not on file  . Drug use: Not on file  . Sexual activity: Not on file  Other Topics Concern  . Not on file  Social History Narrative  . Not on file   No Known Allergies Family History  Problem Relation Age of Onset  . Cancer Mother   . Osteoporosis Mother      Past medical history, social, surgical and family history all reviewed in electronic medical record.  No pertanent information unless stated regarding to the chief complaint.   Review of Systems:Review of systems updated and as accurate as of 01/16/17  No headache, visual changes, nausea, vomiting, diarrhea, constipation, dizziness, abdominal pain, skin  rash, fevers, chills, night sweats, weight loss, swollen lymph nodes, body aches, joint swelling, muscle aches, chest pain, shortness of breath, mood changes.   Objective  There were no vitals taken for this visit. Systems examined below as of 01/16/17   General: No apparent distress alert and oriented x3 mood and affect normal, dressed appropriately.  HEENT: Pupils equal, extraocular movements intact  Respiratory: Patient's speak in full sentences and does not appear short of breath  Cardiovascular: No lower extremity edema, non tender, no erythema  Skin: Warm dry intact with no signs of infection or rash on extremities or on axial skeleton.  Abdomen: Soft nontender  Neuro: Cranial nerves II through XII are intact, neurovascularly intact in all extremities with 2+ DTRs and 2+ pulses.  Lymph: No lymphadenopathy of posterior or anterior cervical chain or axillae bilaterally.  Gait normal with good balance and coordination.  MSK:  Non tender with full range of motion and good stability and symmetric strength and tone of shoulders, elbows, wrist, hip, knee and ankles bilaterally.     Impression and Recommendations:     This case required medical decision making of moderate complexity.      Note: This dictation was prepared with Dragon dictation along with smaller phrase technology. Any transcriptional errors that result from this process are unintentional.

## 2017-02-10 NOTE — Progress Notes (Signed)
[name] [place], [place] [address] Phone: [phone] Subjective:      CC: Pain follow-up  [id number]  Amanda Carney is a 57 y.o. female coming in with complaint of knee pain. Her pain and range and motion have improved. She did have improvement from the injection she states. Patient notes that the pain is starting to come back slowly. She notes that 1.5 days after the injection she believes she had a reaction to the injection. She became flushed and then the same thing happened 2 days later.  Patient states that she is able to do daily activities and is.  Has noticed improvement in the range of motion      Past Medical History:  Diagnosis Date  . Hyperlipidemia    Past Surgical History:  Procedure Laterality Date  . ABDOMINAL HYSTERECTOMY    . LAPAROSCOPIC SALPINGO OOPHERECTOMY     Social History   Socioeconomic History  . Marital status: Married    Spouse name: None  . Number of children: None  . Years of education: None  . Highest education level: None  Social Needs  . Financial resource strain: None  . Food insecurity - worry: None  . Food insecurity - inability: None  . Transportation needs - medical: None  . Transportation needs - non-medical: None  Occupational History  . None  Tobacco Use  . Smoking status: Never Smoker  . Smokeless tobacco: Never Used  Substance and Sexual Activity  . Alcohol use: None  . Drug use: None  . Sexual activity: None  Other Topics Concern  . None  Social History Narrative  . None   No Known Allergies Family History  Problem Relation Age of Onset  . Cancer Mother   . Osteoporosis Mother      Past medical history, social, surgical and family history all reviewed in electronic medical record.  No pertanent information unless stated regarding to the chief complaint.   Review of Systems:Review of systems updated and as accurate as of 02/11/17  No headache, visual  changes, nausea, vomiting, diarrhea, constipation, dizziness, abdominal pain, skin rash, fevers, chills, night sweats, weight loss, swollen lymph nodes, body aches, joint swelling, muscle aches, chest pain, shortness of breath, mood changes.   Objective  Blood pressure 122/82, pulse 61, height 5' 2.5" (1.588 m), weight 140 lb (63.5 kg), SpO2 98 %. Systems examined below as of 02/11/17   General: No apparent distress alert and oriented x3 mood and affect normal, dressed appropriately.  HEENT: Pupils equal, extraocular movements intact  Respiratory: Patient's speak in full sentences and does not appear short of breath  Cardiovascular: No lower extremity edema, non tender, no erythema  Skin: Warm dry intact with no signs of infection or rash on extremities or on axial skeleton.  Abdomen: Soft nontender  Neuro: Cranial nerves II through XII are intact, neurovascularly intact in all extremities with 2+ DTRs and 2+ pulses.  Lymph: No lymphadenopathy of posterior or anterior cervical chain or axillae bilaterally.  Gait normal with good balance and coordination.  MSK:  Non tender with full range of motion and good stability and symmetric strength and tone of shoulders, elbows, wrist, hip nand ankles bilaterally.  Knee: Bilateral valgus deformity noted.  Tender to palpation over medial and PF joint line.  Less tenderness than previous exam ROM full in flexion and extension and lower leg rotation. instability with valgus force.  painful patellar compression. Patellar glide with moderate crepitus.  Patellar and quadriceps tendons unremarkable. Hamstring and quadriceps strength is normal.     Impression and Recommendations:     This case required medical decision making of moderate complexity.      Note: This dictation was prepared with Dragon dictation along with smaller phrase technology. Any transcriptional errors that result from this process are unintentional.

## 2017-02-11 ENCOUNTER — Encounter: Payer: Self-pay | Admitting: Family Medicine

## 2017-02-11 ENCOUNTER — Ambulatory Visit (INDEPENDENT_AMBULATORY_CARE_PROVIDER_SITE_OTHER): Payer: 59 | Admitting: Family Medicine

## 2017-02-11 DIAGNOSIS — M170 Bilateral primary osteoarthritis of knee: Secondary | ICD-10-CM | POA: Diagnosis not present

## 2017-02-11 NOTE — Assessment & Plan Note (Signed)
At this moment patient is doing relatively well after the injections.  We will make no significant changes.  Patient's will consider the Visco supplementation will check with her insurance company.  We discussed the deductible would likely need to be met first.  Patient is under that understanding and will consider doing a trip.  Patient has a trip in May and will definitely have some type of intervention before then.  Follow-up again 5 weeks

## 2017-02-11 NOTE — Patient Instructions (Signed)
Good to see you  Alvera Singh is your friend.  I think you would benefit from viscosupplementation. I would ask you to check with your insurance on coverage. Information they will need includes Diagnosis code- M17.0, M17.2 CPT codes:    [employer] [id number]   [employer]isc [id number]     call us at [phone] if you need [place] sooner but otherwise make an appointment in 5 weeks

## 2017-02-12 DIAGNOSIS — Z0000 Encounter for general adult medical examination without abnormal findings: Secondary | ICD-10-CM | POA: Diagnosis not present

## 2017-02-12 DIAGNOSIS — Z131 Encounter for screening for diabetes mellitus: Secondary | ICD-10-CM | POA: Diagnosis not present

## 2017-02-12 DIAGNOSIS — E782 Mixed hyperlipidemia: Secondary | ICD-10-CM | POA: Diagnosis not present

## 2017-02-13 ENCOUNTER — Ambulatory Visit: Payer: 59 | Admitting: Family Medicine

## 2017-02-19 DIAGNOSIS — M150 Primary generalized (osteo)arthritis: Secondary | ICD-10-CM | POA: Diagnosis not present

## 2017-02-19 DIAGNOSIS — Z0000 Encounter for general adult medical examination without abnormal findings: Secondary | ICD-10-CM | POA: Diagnosis not present

## 2017-02-19 DIAGNOSIS — E782 Mixed hyperlipidemia: Secondary | ICD-10-CM | POA: Diagnosis not present

## 2017-03-17 NOTE — Progress Notes (Signed)
[facility] [address] [place], [place] [address] Phone: [phone] Subjective:    I'm seeing this patient by the request  of:    CC: Knee pain follow-up  [id number]  Amanda Carney is a 57 y.o. female coming in with complaint of knee pain.  Found to have some degenerative changes of the knee.  Patient failed conservative therapy and was checking on Visco supplementation.  Last injection was greater than 10 weeks ago.  Patient states having worsening pain again.  Patient is looking for something more chronic.  We have check with her insurance and patient is under the understanding that Visco supplementation could be covered up to 70% after her detectable has been met.  Patient is considering having that done today.  Has a trip in May which she will need to do a lot of ambulation for    Past Medical History:  Diagnosis Date  . Hyperlipidemia    Past Surgical History:  Procedure Laterality Date  . ABDOMINAL HYSTERECTOMY    . LAPAROSCOPIC SALPINGO OOPHERECTOMY     Social History   Socioeconomic History  . Marital status: Married    Spouse name: Not on file  . Number of children: Not on file  . Years of education: Not on file  . Highest education level: Not on file  Social Needs  . Financial resource strain: Not on file  . Food insecurity - worry: Not on file  . Food insecurity - inability: Not on file  . Transportation needs - medical: Not on file  . Transportation needs - non-medical: Not on file  Occupational History  . Not on file  Tobacco Use  . Smoking status: Never Smoker  . Smokeless tobacco: Never Used  Substance and Sexual Activity  . Alcohol use: Not on file  . Drug use: Not on file  . Sexual activity: Not on file  Other Topics Concern  . Not on file  Social History Narrative  . Not on file   No Known Allergies Family History  Problem Relation Age of Onset  . Cancer Mother   . Osteoporosis Mother      Past medical  history, social, surgical and family history all reviewed in electronic medical record.  No pertanent information unless stated regarding to the chief complaint.   Review of Systems:Review of systems updated and as accurate as of 03/17/17  No headache, visual changes, nausea, vomiting, diarrhea, constipation, dizziness, abdominal pain, skin rash, fevers, chills, night sweats, weight loss, swollen lymph nodes, body aches, joint swelling,  chest pain, shortness of breath, mood changes.  Positive muscle aches  Objective  There were no vitals taken for this visit. Systems examined below as of 03/17/17   General: No apparent distress alert and oriented x3 mood and affect normal, dressed appropriately.  HEENT: Pupils equal, extraocular movements intact  Respiratory: Patient's speak in full sentences and does not appear short of breath  Cardiovascular: No lower extremity edema, non tender, no erythema  Skin: Warm dry intact with no signs of infection or rash on extremities or on axial skeleton.  Abdomen: Soft nontender  Neuro: Cranial nerves II through XII are intact, neurovascularly intact in all extremities with 2+ DTRs and 2+ pulses.  Lymph: No lymphadenopathy of posterior or anterior cervical chain or axillae bilaterally.  Gait mild antalgic MSK:  Non tender with full range of motion and good stability and symmetric strength and tone of shoulders, elbows, wrist, hip, and ankles bilaterally.  Knee: Lateral valgus deformity noted.  Abnormal thigh to calf ratio.  Tender to palpation over medial and PF joint line.  ROM full in flexion and extension and lower leg rotation. instability with valgus force.  painful patellar compression. Patellar glide with moderate to severe crepitus. Patellar and quadriceps tendons unremarkable. Hamstring and quadriceps strength is normal.  After informed written and verbal consent, patient was seated on exam table. Right knee was prepped with alcohol swab and  utilizing anterolateral approach, patient's right knee space was injected with15 mg/2.5 mL of Orthovisc(sodium hyaluronate) in a prefilled syringe was injected easily into the knee through a 22-gauge needle..Patient tolerated the procedure well without immediate complications.  After informed written and verbal consent, patient was seated on exam table. Left knee was prepped with alcohol swab and utilizing anterolateral approach, patient's left knee space was injected with15 mg/2.5 mL of Orthovisc(sodium hyaluronate) in a prefilled syringe was injected easily into the knee through a 22-gauge needle..Patient tolerated the procedure well without immediate complications.    Impression and Recommendations:     This case required medical decision making of moderate complexity.      Note: This dictation was prepared with Dragon dictation along with smaller phrase technology. Any transcriptional errors that result from this process are unintentional.

## 2017-03-18 ENCOUNTER — Ambulatory Visit (INDEPENDENT_AMBULATORY_CARE_PROVIDER_SITE_OTHER): Payer: 59 | Admitting: Family Medicine

## 2017-03-18 ENCOUNTER — Encounter: Payer: Self-pay | Admitting: Family Medicine

## 2017-03-18 DIAGNOSIS — M170 Bilateral primary osteoarthritis of knee: Secondary | ICD-10-CM | POA: Diagnosis not present

## 2017-03-18 NOTE — Assessment & Plan Note (Signed)
Conservative therapy.  Started Visco supplementation today.  Patient will continue conservative therapy.  Follow-up again in 1 week for second in a series of 4 injections bilaterally

## 2017-03-18 NOTE — Patient Instructions (Signed)
Good to see you  We started the orthovisc today.  You can discuss with insurance but will be as we discussed.  I don't expect a lot with this first dose. After that each shot hoping for around 20% better See you again next week

## 2017-03-25 ENCOUNTER — Encounter: Payer: Self-pay | Admitting: Family Medicine

## 2017-03-25 ENCOUNTER — Ambulatory Visit (INDEPENDENT_AMBULATORY_CARE_PROVIDER_SITE_OTHER): Payer: 59 | Admitting: Family Medicine

## 2017-03-25 DIAGNOSIS — M170 Bilateral primary osteoarthritis of knee: Secondary | ICD-10-CM | POA: Diagnosis not present

## 2017-03-25 NOTE — Assessment & Plan Note (Signed)
Severe arthritis bilaterally.  Given second in a series of 4 injections in the knees bilaterally.  Follow-up in 1 week for #3.

## 2017-03-25 NOTE — Patient Instructions (Signed)
Good to see you  Ice is your friend  Stay active See you again next week  

## 2017-03-25 NOTE — Progress Notes (Signed)
[name] [address] [place], [place] [address] Phone: [phone] Subjective:    I'm seeing this patient by the request  of:    CC:   [id number]  Amanda Carney is a 57 y.o. female coming in with complaint of bilateral knee pain.  Found to have severe arthritis.  Started Visco supplementation.    Past Medical History:  Diagnosis Date  . Hyperlipidemia    Past Surgical History:  Procedure Laterality Date  . ABDOMINAL HYSTERECTOMY    . LAPAROSCOPIC SALPINGO OOPHERECTOMY     Social History   Socioeconomic History  . Marital status: Married    Spouse name: Not on file  . Number of children: Not on file  . Years of education: Not on file  . Highest education level: Not on file  Social Needs  . Financial resource strain: Not on file  . Food insecurity - worry: Not on file  . Food insecurity - inability: Not on file  . Transportation needs - medical: Not on file  . Transportation needs - non-medical: Not on file  Occupational History  . Not on file  Tobacco Use  . Smoking status: Never Smoker  . Smokeless tobacco: Never Used  Substance and Sexual Activity  . Alcohol use: Not on file  . Drug use: Not on file  . Sexual activity: Not on file  Other Topics Concern  . Not on file  Social History Narrative  . Not on file   No Known Allergies Family History  Problem Relation Age of Onset  . Cancer Mother   . Osteoporosis Mother      Past medical history, social, surgical and family history all reviewed in electronic medical record.  No pertanent information unless stated regarding to the chief complaint.   Review of Systems:Review of systems updated and as accurate as of 03/25/17  No headache, visual changes, nausea, vomiting, diarrhea, constipation, dizziness, abdominal pain, skin rash, fevers, chills, night sweats, weight loss, swollen lymph nodes, body aches, joint swelling, muscle aches, chest pain, shortness of breath, mood  changes.   Objective  Blood pressure 140/84, height 5' 2.5" (1.588 m), weight 142 lb (64.4 kg). Systems examined below as of 03/25/17   General: No apparent distress alert and oriented x3 mood and affect normal, dressed appropriately.  HEENT: Pupils equal, extraocular movements intact  Respiratory: Patient's speak in full sentences and does not appear short of breath  Cardiovascular: No lower extremity edema, non tender, no erythema  Skin: Warm dry intact with no signs of infection or rash on extremities or on axial skeleton.  Abdomen: Soft nontender  Neuro: Cranial nerves II through XII are intact, neurovascularly intact in all extremities with 2+ DTRs and 2+ pulses.  Lymph: No lymphadenopathy of posterior or anterior cervical chain or axillae bilaterally.  Gait normal with good balance and coordination.  MSK:  Non tender with full range of motion and good stability and symmetric strength and tone of shoulders, elbows, wrist, hip and ankles bilaterally.  Knee: bilateral valgus deformity noted. Abnormal thigh to calf ratio.  Tender to palpation over medial and PF joint line.  ROM full in flexion and extension and lower leg rotation. instability with valgus force.  painful patellar compression. Patellar glide with moderate crepitus. Patellar and quadriceps tendons unremarkable. Hamstring and quadriceps strength is normal.  After informed written and verbal consent, patient was seated on exam table. Right knee was prepped with alcohol swab and utilizing anterolateral approach, patient's  right knee space was injected with15 mg/2.5 mL of Orthovisc(sodium hyaluronate) in a prefilled syringe was injected easily into the knee through a 22-gauge needle..Patient tolerated the procedure well without immediate complications.  After informed written and verbal consent, patient was seated on exam table. Left knee was prepped with alcohol swab and utilizing anterolateral approach, patient's left knee  space was injected with15 mg/2.5 mL of Orthovisc(sodium hyaluronate) in a prefilled syringe was injected easily into the knee through a 22-gauge needle..Patient tolerated the procedure well without immediate complications.   Impression and Recommendations:     This case required medical decision making of moderate complexity.      Note: This dictation was prepared with Dragon dictation along with smaller phrase technology. Any transcriptional errors that result from this process are unintentional.

## 2017-03-26 DIAGNOSIS — J399 Disease of upper respiratory tract, unspecified: Secondary | ICD-10-CM | POA: Diagnosis not present

## 2017-04-01 ENCOUNTER — Ambulatory Visit (INDEPENDENT_AMBULATORY_CARE_PROVIDER_SITE_OTHER): Payer: 59 | Admitting: Family Medicine

## 2017-04-01 ENCOUNTER — Encounter: Payer: Self-pay | Admitting: Family Medicine

## 2017-04-01 DIAGNOSIS — M170 Bilateral primary osteoarthritis of knee: Secondary | ICD-10-CM | POA: Diagnosis not present

## 2017-04-01 NOTE — Patient Instructions (Signed)
Good to see you  3 down and 1 to go.  Ice is your friend.  May be a little stiff the next day or 2  See you again 1 week for the last set!

## 2017-04-01 NOTE — Progress Notes (Signed)
[name] [place], [place] [address] Phone: [phone] Subjective:       CC: Bilateral knee pain  [id number]  Amanda Carney is a 57 y.o. female coming in with complaint of bilateral knee pain.  Patient is here for third in a series of 4 injections for Visco supplementation.  He has noticed very mild improvement.  Continues to work out regularly.       Past Medical History:  Diagnosis Date  . Hyperlipidemia    Past Surgical History:  Procedure Laterality Date  . ABDOMINAL HYSTERECTOMY    . LAPAROSCOPIC SALPINGO OOPHERECTOMY     Social History   Socioeconomic History  . Marital status: Married    Spouse name: Not on file  . Number of children: Not on file  . Years of education: Not on file  . Highest education level: Not on file  Occupational History  . Not on file  Social Needs  . Financial resource strain: Not on file  . Food insecurity:    Worry: Not on file    Inability: Not on file  . Transportation needs:    Medical: Not on file    Non-medical: Not on file  Tobacco Use  . Smoking status: Never Smoker  . Smokeless tobacco: Never Used  Substance and Sexual Activity  . Alcohol use: Not on file  . Drug use: Not on file  . Sexual activity: Not on file  Lifestyle  . Physical activity:    Days per week: Not on file    Minutes per session: Not on file  . Stress: Not on file  Relationships  . Social connections:    Talks on phone: Not on file    Gets together: Not on file    Attends religious service: Not on file    Active member of club or organization: Not on file    Attends meetings of clubs or organizations: Not on file    Relationship status: Not on file  Other Topics Concern  . Not on file  Social History Narrative  . Not on file   No Known Allergies Family History  Problem Relation Age of Onset  . Cancer Mother   . Osteoporosis Mother      Past medical history, social, surgical and family  history all reviewed in electronic medical record.  No pertanent information unless stated regarding to the chief complaint.   Review of Systems:Review of systems updated and as accurate as of 04/01/17  No headache, visual changes, nausea, vomiting, diarrhea, constipation, dizziness, abdominal pain, skin rash, fevers, chills, night sweats, weight loss, swollen lymph nodes, body aches, joint swelling, chest pain, shortness of breath, mood changes.  Positive muscle aches  Objective  Blood pressure (!) 160/86, height 5' 2.5" (1.588 m), weight 140 lb (63.5 kg). Systems examined below as of 04/01/17   General: No apparent distress alert and oriented x3 mood and affect normal, dressed appropriately.  HEENT: Pupils equal, extraocular movements intact  Respiratory: Patient's speak in full sentences and does not appear short of breath  Cardiovascular: No lower extremity edema, non tender, no erythema  Skin: Warm dry intact with no signs of infection or rash on extremities or on axial skeleton.  Abdomen: Soft nontender  Neuro: Cranial nerves II through XII are intact, neurovascularly intact in all extremities with 2+ DTRs and 2+ pulses.  Lymph: No lymphadenopathy of posterior or anterior cervical chain or axillae bilaterally.  Gait normal with good  balance and coordination.  MSK:  Non tender with full range of motion and good stability and symmetric strength and tone of shoulders, elbows, wrist, hip and ankles bilaterally.  Knee: Bilateral valgus deformity noted. Large thigh to calf ratio.  Tender to palpation over medial and PF joint line.  ROM full in flexion and extension and lower leg rotation. instability with valgus force.  painful patellar compression. Patellar glide with moderate crepitus. Patellar and quadriceps tendons unremarkable. Hamstring and quadriceps strength is normal.  After informed written and verbal consent, patient was seated on exam table. Right knee was prepped with  alcohol swab and utilizing anterolateral approach, patient's right knee space was injected with15 mg/2.5 mL of Orthovisc(sodium hyaluronate) in a prefilled syringe was injected easily into the knee through a 22-gauge needle..Patient tolerated the procedure well without immediate complications.  After informed written and verbal consent, patient was seated on exam table. Left knee was prepped with alcohol swab and utilizing anterolateral approach, patient's left knee space was injected with15 mg/2.5 mL of Orthovisc(sodium hyaluronate) in a prefilled syringe was injected easily into the knee through a 22-gauge needle..Patient tolerated the procedure well without immediate complications.    Impression and Recommendations:     This case required medical decision making of moderate complexity.      Note: This dictation was prepared with Dragon dictation along with smaller phrase technology. Any transcriptional errors that result from this process are unintentional.

## 2017-04-01 NOTE — Assessment & Plan Note (Signed)
Bilateral injections given today.  Tolerated the procedure well.  One more in 1 week bilaterally and will be finished with Visco supplementation

## 2017-04-07 NOTE — Progress Notes (Signed)
[name] [place] [address] [place], [place] [address] Phone: [phone] Subjective:    I'm seeing this patient by the request  of:    CC: Bilateral knee pain follow-up  [id number]  Amanda Carney is a 57 y.o. female coming in with complaint of bilateral knee pain.  Severe knee arthritis medially.  Failed all conservative therapy.  Here for fourth and final injection and Visco supplementation.  Patient states doing better.  States that there is some fullness.     Past Medical History:  Diagnosis Date  . Hyperlipidemia    Past Surgical History:  Procedure Laterality Date  . ABDOMINAL HYSTERECTOMY    . LAPAROSCOPIC SALPINGO OOPHERECTOMY     Social History   Socioeconomic History  . Marital status: Married    Spouse name: Not on file  . Number of children: Not on file  . Years of education: Not on file  . Highest education level: Not on file  Occupational History  . Not on file  Social Needs  . Financial resource strain: Not on file  . Food insecurity:    Worry: Not on file    Inability: Not on file  . Transportation needs:    Medical: Not on file    Non-medical: Not on file  Tobacco Use  . Smoking status: Never Smoker  . Smokeless tobacco: Never Used  Substance and Sexual Activity  . Alcohol use: Not on file  . Drug use: Not on file  . Sexual activity: Not on file  Lifestyle  . Physical activity:    Days per week: Not on file    Minutes per session: Not on file  . Stress: Not on file  Relationships  . Social connections:    Talks on phone: Not on file    Gets together: Not on file    Attends religious service: Not on file    Active member of club or organization: Not on file    Attends meetings of clubs or organizations: Not on file    Relationship status: Not on file  Other Topics Concern  . Not on file  Social History Narrative  . Not on file   No Known Allergies Family History  Problem Relation Age of Onset  .  Cancer Mother   . Osteoporosis Mother      Past medical history, social, surgical and family history all reviewed in electronic medical record.  No pertanent information unless stated regarding to the chief complaint.   Review of Systems:Review of systems updated and as accurate as of 04/07/17  No headache, visual changes, nausea, vomiting, diarrhea, constipation, dizziness, abdominal pain, skin rash, fevers, chills, night sweats, weight loss, swollen lymph nodes, body aches, joint swelling, muscle aches, chest pain, shortness of breath, mood changes.   Objective  There were no vitals taken for this visit. Systems examined below as of 04/07/17   General: No apparent distress alert and oriented x3 mood and affect normal, dressed appropriately.  HEENT: Pupils equal, extraocular movements intact  Respiratory: Patient's speak in full sentences and does not appear short of breath  Cardiovascular: No lower extremity edema, non tender, no erythema  Skin: Warm dry intact with no signs of infection or rash on extremities or on axial skeleton.  Abdomen: Soft nontender  Neuro: Cranial nerves II through XII are intact, neurovascularly intact in all extremities with 2+ DTRs and 2+ pulses.  Lymph: No lymphadenopathy of posterior or anterior cervical chain or axillae bilaterally.  Gait normal with good balance and coordination.  MSK:  Non tender with full range of motion and good stability and symmetric strength and tone of shoulders, elbows, wrist, hip, and ankles bilaterally.  Knee: Bilateral valgus deformity noted. Large thigh to calf ratio.  Tender to palpation over medial and PF joint line.  ROM full in flexion and extension and lower leg rotation. instability with valgus force.  painful patellar compression. Patellar glide with moderate crepitus. Patellar and quadriceps tendons unremarkable. Hamstring and quadriceps strength is normal.  After informed written and verbal consent, patient was  seated on exam table. Right knee was prepped with alcohol swab and utilizing anterolateral approach, patient's right knee space was injected with15 mg/2.5 mL of Orthovisc(sodium hyaluronate) in a prefilled syringe was injected easily into the knee through a 22-gauge needle..Patient tolerated the procedure well without immediate complications.  After informed written and verbal consent, patient was seated on exam table. Left knee was prepped with alcohol swab and utilizing anterolateral approach, patient's left knee space was injected with15 mg/2.5 mL of Orthovisc(sodium hyaluronate) in a prefilled syringe was injected easily into the knee through a 22-gauge needle..Patient tolerated the procedure well without immediate complications.   Impression and Recommendations:     This case required medical decision making of moderate complexity.      Note: This dictation was prepared with Dragon dictation along with smaller phrase technology. Any transcriptional errors that result from this process are unintentional.

## 2017-04-08 ENCOUNTER — Encounter: Payer: Self-pay | Admitting: Family Medicine

## 2017-04-08 ENCOUNTER — Ambulatory Visit (INDEPENDENT_AMBULATORY_CARE_PROVIDER_SITE_OTHER): Payer: 59 | Admitting: Family Medicine

## 2017-04-08 DIAGNOSIS — M170 Bilateral primary osteoarthritis of knee: Secondary | ICD-10-CM | POA: Diagnosis not present

## 2017-04-08 NOTE — Patient Instructions (Signed)
You made it!!!! All done  You get a break form me Keep it up on everything else See me again in 4 weeks if still having pain or otherwise can repeat injections in 6 months!

## 2017-04-08 NOTE — Assessment & Plan Note (Signed)
Visco supplementation pain is now.  Continue conservative therapy.  Follow-up again in 4 weeks

## 2017-04-30 DIAGNOSIS — Z01419 Encounter for gynecological examination (general) (routine) without abnormal findings: Secondary | ICD-10-CM | POA: Diagnosis not present

## 2017-04-30 DIAGNOSIS — Z124 Encounter for screening for malignant neoplasm of cervix: Secondary | ICD-10-CM | POA: Diagnosis not present

## 2017-05-01 DIAGNOSIS — Z124 Encounter for screening for malignant neoplasm of cervix: Secondary | ICD-10-CM | POA: Diagnosis not present

## 2017-05-05 NOTE — Progress Notes (Deleted)
[name] [place], [place] [address] Phone: [phone] Subjective:    I'm seeing this patient by the request  of:    CC:   [id number]  Amanda Carney is a 57 y.o. female coming in with complaint of ***  Onset-  Location Duration-  Character- Aggravating factors- Reliving factors-  Therapies tried-  Severity-     Past Medical History:  Diagnosis Date  . Hyperlipidemia    Past Surgical History:  Procedure Laterality Date  . ABDOMINAL HYSTERECTOMY    . LAPAROSCOPIC SALPINGO OOPHERECTOMY     Social History   Socioeconomic History  . Marital status: Married    Spouse name: Not on file  . Number of children: Not on file  . Years of education: Not on file  . Highest education level: Not on file  Occupational History  . Not on file  Social Needs  . Financial resource strain: Not on file  . Food insecurity:    Worry: Not on file    Inability: Not on file  . Transportation needs:    Medical: Not on file    Non-medical: Not on file  Tobacco Use  . Smoking status: Never Smoker  . Smokeless tobacco: Never Used  Substance and Sexual Activity  . Alcohol use: Not on file  . Drug use: Not on file  . Sexual activity: Not on file  Lifestyle  . Physical activity:    Days per week: Not on file    Minutes per session: Not on file  . Stress: Not on file  Relationships  . Social connections:    Talks on phone: Not on file    Gets together: Not on file    Attends religious service: Not on file    Active member of club or organization: Not on file    Attends meetings of clubs or organizations: Not on file    Relationship status: Not on file  Other Topics Concern  . Not on file  Social History Narrative  . Not on file   No Known Allergies Family History  Problem Relation Age of Onset  . Cancer Mother   . Osteoporosis Mother      Past medical history, social, surgical and family history all reviewed in  electronic medical record.  No pertanent information unless stated regarding to the chief complaint.   Review of Systems:Review of systems updated and as accurate as of 05/05/17  No headache, visual changes, nausea, vomiting, diarrhea, constipation, dizziness, abdominal pain, skin rash, fevers, chills, night sweats, weight loss, swollen lymph nodes, body aches, joint swelling, muscle aches, chest pain, shortness of breath, mood changes.   Objective  There were no vitals taken for this visit. Systems examined below as of 05/05/17   General: No apparent distress alert and oriented x3 mood and affect normal, dressed appropriately.  HEENT: Pupils equal, extraocular movements intact  Respiratory: Patient's speak in full sentences and does not appear short of breath  Cardiovascular: No lower extremity edema, non tender, no erythema  Skin: Warm dry intact with no signs of infection or rash on extremities or on axial skeleton.  Abdomen: Soft nontender  Neuro: Cranial nerves II through XII are intact, neurovascularly intact in all extremities with 2+ DTRs and 2+ pulses.  Lymph: No lymphadenopathy of posterior or anterior cervical chain or axillae bilaterally.  Gait normal with good balance and coordination.  MSK:  Non tender with full range of motion and good stability  and symmetric strength and tone of shoulders, elbows, wrist, hip, knee and ankles bilaterally.     Impression and Recommendations:     This case required medical decision making of moderate complexity.      Note: This dictation was prepared with Dragon dictation along with smaller phrase technology. Any transcriptional errors that result from this process are unintentional.

## 2017-05-06 ENCOUNTER — Ambulatory Visit: Payer: 59 | Admitting: Family Medicine

## 2017-05-06 DIAGNOSIS — N6321 Unspecified lump in the left breast, upper outer quadrant: Secondary | ICD-10-CM | POA: Diagnosis not present

## 2017-05-06 DIAGNOSIS — Z0289 Encounter for other administrative examinations: Secondary | ICD-10-CM

## 2017-05-06 DIAGNOSIS — N6323 Unspecified lump in the left breast, lower outer quadrant: Secondary | ICD-10-CM | POA: Diagnosis not present

## 2017-05-06 DIAGNOSIS — R922 Inconclusive mammogram: Secondary | ICD-10-CM | POA: Diagnosis not present

## 2017-05-14 DIAGNOSIS — L570 Actinic keratosis: Secondary | ICD-10-CM | POA: Diagnosis not present

## 2017-05-14 DIAGNOSIS — L820 Inflamed seborrheic keratosis: Secondary | ICD-10-CM | POA: Diagnosis not present

## 2017-05-19 NOTE — Progress Notes (Signed)
Amanda Carney [place] [place], [place] [address] Phone: [phone] Subjective:     CC: Bilateral knee pain  IRJ:[id number]  Amanda Carney is a 57 y.o. female coming in with complaint of bilateral knee pain. She finished orthovisc last visit. Patient is here today as she notes swelling in the right knee and a lump on the lateral aspect of the knee. Patient did finally have relief since last Thursday.  Patient states that overall seems to be continued to have pain.  Concerned because patient is going on a trip where she will be walking for 15 days.    Past Medical History:  Diagnosis Date  . Hyperlipidemia    Past Surgical History:  Procedure Laterality Date  . ABDOMINAL HYSTERECTOMY    . LAPAROSCOPIC SALPINGO OOPHERECTOMY     Social History   Socioeconomic History  . Marital status: Married    Spouse name: Not on file  . Number of children: Not on file  . Years of education: Not on file  . Highest education level: Not on file  Occupational History  . Not on file  Social Needs  . Financial resource strain: Not on file  . Food insecurity:    Worry: Not on file    Inability: Not on file  . Transportation needs:    Medical: Not on file    Non-medical: Not on file  Tobacco Use  . Smoking status: Never Smoker  . Smokeless tobacco: Never Used  Substance and Sexual Activity  . Alcohol use: Not on file  . Drug use: Not on file  . Sexual activity: Not on file  Lifestyle  . Physical activity:    Days per week: Not on file    Minutes per session: Not on file  . Stress: Not on file  Relationships  . Social connections:    Talks on phone: Not on file    Gets together: Not on file    Attends religious service: Not on file    Active member of club or organization: Not on file    Attends meetings of clubs or organizations: Not on file    Relationship status: Not on file  Other Topics Concern  . Not on file  Social History Narrative  .  Not on file   No Known Allergies Family History  Problem Relation Age of Onset  . Cancer Mother   . Osteoporosis Mother      Past medical history, social, surgical and family history all reviewed in electronic medical record.  No pertanent information unless stated regarding to the chief complaint.   Review of Systems:Review of systems updated and as accurate as of 05/20/17  No headache, visual changes, nausea, vomiting, diarrhea, constipation, dizziness, abdominal pain, skin rash, fevers, chills, night sweats, weight loss, swollen lymph nodes, body aches, joint swelling,  chest pain, shortness of breath, mood changes.  Positive muscle aches  Objective  Blood pressure 134/88, pulse 71, height 5' 2.5" (1.588 m), weight 138 lb (62.6 kg), SpO2 98 %. Systems examined below as of 05/20/17   General: No apparent distress alert and oriented x3 mood and affect normal, dressed appropriately.  HEENT: Pupils equal, extraocular movements intact  Respiratory: Patient's speak in full sentences and does not appear short of breath  Cardiovascular: No lower extremity edema, non tender, no erythema  Skin: Warm dry intact with no signs of infection or rash on extremities or on axial skeleton.  Abdomen: Soft nontender  Neuro:  Cranial nerves II through XII are intact, neurovascularly intact in all extremities with 2+ DTRs and 2+ pulses.  Lymph: No lymphadenopathy of posterior or anterior cervical chain or axillae bilaterally.  Gait antalgic MSK:  Non tender with full range of motion and good stability and symmetric strength and tone of shoulders, elbows, wrist, hip, and ankles bilaterally.   Knee: Bilateral valgus deformity noted.  Abnormal thigh to calf ratio.  Tender to palpation over medial and PF joint line.  ROM full in flexion and extension and lower leg rotation. instability with valgus force.  painful patellar compression. Patellar glide with moderate crepitus. Patellar and quadriceps tendons  unremarkable. Hamstring and quadriceps strength is normal.  After informed written and verbal consent, patient was seated on exam table. Right knee was prepped with alcohol swab and utilizing anterolateral approach, patient's right knee space was injected with 4:1  marcaine 0.5%: Kenalog 40mg /dL. Patient tolerated the procedure well without immediate complications.  After informed written and verbal consent, patient was seated on exam table. Left knee was prepped with alcohol swab and utilizing anterolateral approach, patient's left knee space was injected with 4:1  marcaine 0.5%: Kenalog 40mg /dL. Patient tolerated the procedure well without immediate complications.    Impression and Recommendations:     This case required medical decision making of moderate complexity.      Note: This dictation was prepared with Dragon dictation along with smaller phrase technology. Any transcriptional errors that result from this process are unintentional.

## 2017-05-20 ENCOUNTER — Encounter: Payer: Self-pay | Admitting: Family Medicine

## 2017-05-20 ENCOUNTER — Ambulatory Visit (INDEPENDENT_AMBULATORY_CARE_PROVIDER_SITE_OTHER): Payer: 59 | Admitting: Family Medicine

## 2017-05-20 DIAGNOSIS — M170 Bilateral primary osteoarthritis of knee: Secondary | ICD-10-CM

## 2017-05-20 NOTE — Patient Instructions (Signed)
Good to see you  Ice is your friend  Have a great trip! See me again in 6 weeks

## 2017-05-20 NOTE — Assessment & Plan Note (Signed)
Bilateral injections given.  Discussed icing regimen and home exercise.  Discussed which activities to do which wants to avoid.  Discussed topical anti-inflammatories.  Follow-up again in 6 weeks

## 2017-07-30 DIAGNOSIS — C4362 Malignant melanoma of left upper limb, including shoulder: Secondary | ICD-10-CM | POA: Diagnosis not present

## 2017-07-30 DIAGNOSIS — D225 Melanocytic nevi of trunk: Secondary | ICD-10-CM | POA: Diagnosis not present

## 2017-07-30 DIAGNOSIS — D485 Neoplasm of uncertain behavior of skin: Secondary | ICD-10-CM | POA: Diagnosis not present

## 2017-07-30 DIAGNOSIS — Z808 Family history of malignant neoplasm of other organs or systems: Secondary | ICD-10-CM | POA: Diagnosis not present

## 2017-07-30 DIAGNOSIS — L570 Actinic keratosis: Secondary | ICD-10-CM | POA: Diagnosis not present

## 2017-08-22 DIAGNOSIS — C4362 Malignant melanoma of left upper limb, including shoulder: Secondary | ICD-10-CM | POA: Diagnosis not present

## 2017-08-22 DIAGNOSIS — D0362 Melanoma in situ of left upper limb, including shoulder: Secondary | ICD-10-CM | POA: Diagnosis not present

## 2017-09-17 DIAGNOSIS — M150 Primary generalized (osteo)arthritis: Secondary | ICD-10-CM | POA: Diagnosis not present

## 2017-09-17 DIAGNOSIS — E782 Mixed hyperlipidemia: Secondary | ICD-10-CM | POA: Diagnosis not present

## 2017-09-24 DIAGNOSIS — E782 Mixed hyperlipidemia: Secondary | ICD-10-CM | POA: Diagnosis not present

## 2017-09-24 DIAGNOSIS — M150 Primary generalized (osteo)arthritis: Secondary | ICD-10-CM | POA: Diagnosis not present

## 2017-10-02 DIAGNOSIS — Z5189 Encounter for other specified aftercare: Secondary | ICD-10-CM | POA: Diagnosis not present

## 2017-10-02 DIAGNOSIS — T8149XA Infection following a procedure, other surgical site, initial encounter: Secondary | ICD-10-CM | POA: Diagnosis not present

## 2017-11-07 DIAGNOSIS — Z23 Encounter for immunization: Secondary | ICD-10-CM | POA: Diagnosis not present

## 2017-11-26 DIAGNOSIS — L570 Actinic keratosis: Secondary | ICD-10-CM | POA: Diagnosis not present

## 2017-11-26 DIAGNOSIS — L821 Other seborrheic keratosis: Secondary | ICD-10-CM | POA: Diagnosis not present

## 2017-11-26 DIAGNOSIS — Z85820 Personal history of malignant melanoma of skin: Secondary | ICD-10-CM | POA: Diagnosis not present

## 2017-12-10 ENCOUNTER — Telehealth: Payer: Self-pay | Admitting: Licensed Clinical Social Worker

## 2017-12-10 ENCOUNTER — Encounter: Payer: Self-pay | Admitting: Licensed Clinical Social Worker

## 2017-12-10 NOTE — Telephone Encounter (Signed)
Received a genetic counseling referral from Dr. Delman Cheadle. Pt has been cld and scheduled to see Faith Rogue on 12/30 at 2pm. Pt awar to arrive 15 minutes early. Letter mailed.

## 2018-01-05 ENCOUNTER — Inpatient Hospital Stay: Payer: 59 | Attending: Genetic Counselor | Admitting: Licensed Clinical Social Worker

## 2018-01-05 ENCOUNTER — Inpatient Hospital Stay: Payer: 59

## 2018-01-05 ENCOUNTER — Encounter: Payer: Self-pay | Admitting: Licensed Clinical Social Worker

## 2018-01-05 DIAGNOSIS — Z8541 Personal history of malignant neoplasm of cervix uteri: Secondary | ICD-10-CM | POA: Diagnosis not present

## 2018-01-05 DIAGNOSIS — Z8041 Family history of malignant neoplasm of ovary: Secondary | ICD-10-CM | POA: Diagnosis not present

## 2018-01-05 DIAGNOSIS — Z800 Family history of malignant neoplasm of digestive organs: Secondary | ICD-10-CM | POA: Diagnosis not present

## 2018-01-05 DIAGNOSIS — Z808 Family history of malignant neoplasm of other organs or systems: Secondary | ICD-10-CM | POA: Diagnosis not present

## 2018-01-05 DIAGNOSIS — Z7183 Encounter for nonprocreative genetic counseling: Secondary | ICD-10-CM

## 2018-01-05 DIAGNOSIS — Z801 Family history of malignant neoplasm of trachea, bronchus and lung: Secondary | ICD-10-CM | POA: Insufficient documentation

## 2018-01-05 DIAGNOSIS — Z86006 Personal history of melanoma in-situ: Secondary | ICD-10-CM | POA: Insufficient documentation

## 2018-01-05 NOTE — Progress Notes (Signed)
REFERRING PROVIDER: Jari Pigg, MD [address], [address] [place], [place]  PRIMARY PROVIDER:  Merrilee Seashore, MD  PRIMARY REASON FOR VISIT:  1. Family history of melanoma   2. Family history of pancreatic cancer   3. Family history of ovarian cancer   4. Family history of lung cancer   5. Family history of stomach cancer   6. Personal history of melanoma in-situ      HISTORY OF PRESENT ILLNESS:   Ms. Mcclafferty, a 57 y.o. female, was seen for a [facility] cancer genetics consultation at the request of Dr. Delman Cheadle due to a family history of cancer.  Ms. Cannaday presents to clinic today to discuss the possibility of a hereditary predisposition to cancer, genetic testing, and to further clarify her future cancer risks, as well as potential cancer risks for family members.   At the age of 16, Ms. Critcher reports she had cervical cancer. At the age of 19, Ms. Pontarelli reports she had a melanoma removed.   HORMONAL RISK FACTORS:  Menarche was at age 66.  First live birth at age : no biological children.  OCP use for approximately 10 years.  Ovaries intact: no.  Hysterectomy: yes.  Menopausal status: postmenopausal.  HRT use: 20 years. Colonoscopy: yes; normal. Mammogram within the last year: yes. Number of breast biopsies: 0.  Past Medical History:  Diagnosis Date  . Family history of lung cancer   . Family history of melanoma   . Family history of ovarian cancer   . Family history of pancreatic cancer   . Family history of stomach cancer   . Hyperlipidemia   . Personal history of melanoma in-situ     Past Surgical History:  Procedure Laterality Date  . ABDOMINAL HYSTERECTOMY    . LAPAROSCOPIC SALPINGO OOPHERECTOMY      Social History   Socioeconomic History  . Marital status: Married    Spouse name: Not on file  . Number of children: Not on file  . Years of education: Not on file  . Highest education level: Not on file  Occupational History  . Not on file   Social Needs  . Financial resource strain: Not on file  . Food insecurity:    Worry: Not on file    Inability: Not on file  . Transportation needs:    Medical: Not on file    Non-medical: Not on file  Tobacco Use  . Smoking status: Never Smoker  . Smokeless tobacco: Never Used  Substance and Sexual Activity  . Alcohol use: Not on file  . Drug use: Not on file  . Sexual activity: Not on file  Lifestyle  . Physical activity:    Days per week: Not on file    Minutes per session: Not on file  . Stress: Not on file  Relationships  . Social connections:    Talks on phone: Not on file    Gets together: Not on file    Attends religious service: Not on file    Active member of club or organization: Not on file    Attends meetings of clubs or organizations: Not on file    Relationship status: Not on file  Other Topics Concern  . Not on file  Social History Narrative  . Not on file     FAMILY HISTORY:  We obtained a detailed, 4-generation family history.  Significant diagnoses are listed below: Family History  Problem Relation Age of Onset  . Osteoporosis Mother   .  Ovarian cancer Mother 20  . Melanoma Sister        x8  . Lung cancer Maternal Uncle 82  . Stomach cancer Paternal Aunt        dx late 44s  . Cancer Paternal Uncle        unk type  . Pancreatic cancer Maternal Grandmother 80  . Lung cancer Maternal Grandfather   . Cancer Paternal Aunt        unk type    Ms. Hattabaugh has one adopted son, age 10. She has two sisters. One of her sister, age 44, has had 8 melanomas removed and has one daughter. Her other sister is 69 with no children.   Ms. Kramm mother had ovarian cancer diagnosed at 10 and died at 32. She had two brothers. One had lung cancer and died at 2, he did have history of smoking. No cancers in cousins. Her maternal grandmother had pancreatic cancer diagnosed at 15 and died shortly after. Her maternal grandfather had lung cancer and died in his 76s.    Ms. Chrismer father died at 37. He had three sisters and a brother. One of his sisters had cancer, unknown type, and a brother also had cancer, unknown type, both have died. One of his sisters currently has stomach cancer and is in her late 16s. The other sister is cancer free and in her 51s. A paternal cousin had thyroid cancer. Her paternal grandmother died in her 69s and possibly had cancer, paternal grandfather died in his mid 21s.  Ms. Dubow is unaware of previous family history of genetic testing for hereditary cancer risks. Patient's ancestors are of Welsh/Irish/British descent. There is no reported Ashkenazi Jewish ancestry. There is no known consanguinity.  GENETIC COUNSELING ASSESSMENT: NANCEY KREITZ is a 57 y.o. female with a family history which is somewhat suggestive of a Hereditary Cancer Predisposition Syndrome. We, therefore, discussed and recommended the following at today's visit.   DISCUSSION: We discussed that about 5-10% of cancer cases are hereditary. We discussed the BRCA genes and CDKN2A, noting there are many other genes that are each associated with their own cancers and cancer risks. We reviewed the characteristics, features and inheritance patterns of hereditary cancer syndromes. We also discussed genetic testing, including the appropriate family members to test, the process of testing, insurance coverage and turn-around-time for results. We discussed the implications of a negative, positive and/or variant of uncertain significant result. We recommended Ms. Heminger pursue genetic testing for the [facility] Common Hereditary Cancers Panel + Melanoma panel.  The Common Hereditary Cancers Panel offered by [facility] includes sequencing and/or deletion duplication testing of the following 47 genes: APC, ATM, AXIN2, BARD1, BMPR1A, BRCA1, BRCA2, BRIP1, CDH1, CDKN2A (p14ARF), CDKN2A (p16INK4a), CKD4, CHEK2, CTNNA1, DICER1, EPCAM (Deletion/duplication testing only), GREM1 (promoter region  deletion/duplication testing only), KIT, MEN1, MLH1, MSH2, MSH3, MSH6, MUTYH, NBN, NF1, NHTL1, PALB2, PDGFRA, PMS2, POLD1, POLE, PTEN, RAD50, RAD51C, RAD51D, SDHB, SDHC, SDHD, SMAD4, SMARCA4. STK11, TP53, TSC1, TSC2, and VHL.  The following genes were evaluated for sequence changes only: SDHA and HOXB13 c.251G>A variant only.  The [facility] Melanoma Panel analyzes the following 9 genes: BAP1 BRCA2 CDK4 CDKN2A MITF POT1 PTEN RB1 TP53.  We discussed that if she is found to have a mutation in one of these genes, it may impact future medical management recommendations such as increased cancer screenings and consideration of risk reducing surgeries.  A positive result could also have implications for the patient's family members.  A Negative result  would mean we were unable to identify a hereditary component to her family history of cancer but does not rule out the possibility of a hereditary basis for her family history of cancer.  There could be mutations that are undetectable by current technology, or in genes not yet tested or identified to increase cancer risk.    We discussed the potential to find a Variant of Uncertain Significance or VUS.  These are variants that have not yet been identified as pathogenic or benign, and it is unknown if this variant is associated with increased cancer risk or if this is a normal finding.  Most VUS's are reclassified to benign or likely benign.   It should not be used to make medical management decisions. With time, we suspect the lab will determine the significance of any VUS's identified if any.   Based on Ms. Barkalow's family history of cancer, she meets NCCN medical criteria for genetic testing. Despite that she meets criteria, she may still have an out of pocket cost. The lab will notify her of an OOP if any.  We discussed that some people do not want to undergo genetic testing due to fear of genetic discrimination.  A federal law called the Genetic Information  Non-Discrimination Act (GINA) of 2008 helps protect individuals against genetic discrimination based on their genetic test results.  It impacts both health insurance and employment.  For health insurance, it protects against increased premiums, being kicked off insurance or being forced to take a test in order to be insured.  For employment it protects against hiring, firing and promoting decisions based on genetic test results.  Health status due to a cancer diagnosis is not protected under GINA.  This law does not protect life insurance, disability insurance, or other types of insurance.   PLAN: After considering the risks, benefits, and limitations, Ms. Tine  provided informed consent to pursue genetic testing and the blood sample was sent to [facility] for analysis of the Common Hereditary Cancers Panel + Melanoma Panel. Results should be available within approximately 2-3 weeks' time, at which point they will be disclosed by telephone to Ms. Marlett, as will any additional recommendations warranted by these results. Ms. Azzarello will receive a summary of her genetic counseling visit and a copy of her results once available. This information will also be available in Epic.  Based on Ms. Summons's family history, we recommended her sisters and maternal relatives have genetic counseling and testing. Ms. Feltner will let us know if we can be of any assistance in coordinating genetic counseling and/or testing for this family member.   Lastly, we encouraged Ms. Bradish to remain in contact with cancer genetics annually so that we can continuously update the family history and inform her of any changes in cancer genetics and testing that may be of benefit for this family.   Ms.  Fischetti questions were answered to her satisfaction today. Our contact information was provided should additional questions or concerns arise. Thank you for the referral and allowing [place] to share in the care of your patient.    Faith Rogue, MS Genetic Counselor [email or web address] Phone: [phone]  The patient was seen for a total of 40 minutes in face-to-face genetic counseling.

## 2018-01-16 ENCOUNTER — Ambulatory Visit: Payer: Self-pay | Admitting: Licensed Clinical Social Worker

## 2018-01-16 ENCOUNTER — Telehealth: Payer: Self-pay | Admitting: Licensed Clinical Social Worker

## 2018-01-16 ENCOUNTER — Encounter: Payer: Self-pay | Admitting: Licensed Clinical Social Worker

## 2018-01-16 DIAGNOSIS — Z1379 Encounter for other screening for genetic and chromosomal anomalies: Secondary | ICD-10-CM | POA: Insufficient documentation

## 2018-01-16 NOTE — Telephone Encounter (Signed)
Revealed negative genetic testing.  This normal result is reassuring.  It is unlikely that there is an increased risk of another cancer due to a mutation in one of these genes.  However, genetic testing is not perfect, and cannot definitively rule out a hereditary cause.  It will be important for her to keep in contact with genetics to learn if any additional testing may be needed in the future.    ? ? ?

## 2018-01-16 NOTE — Progress Notes (Signed)
HPI:  Amanda Carney was previously seen in the [facility] clinic on due to a family history of cancer and concerns regarding a hereditary predisposition to cancer. Please refer to our prior cancer genetics clinic note for more information regarding Amanda Carney's medical, social and family histories, and our assessment and recommendations, at the time. Amanda Carney recent genetic test results were disclosed to her, as well as recommendations warranted by these results. These results and recommendations are discussed in more detail below.  FAMILY HISTORY:  We obtained a detailed, 4-generation family history.  Significant diagnoses are listed below: Family History  Problem Relation Age of Onset  . Osteoporosis Mother   . Ovarian cancer Mother 73  . Melanoma Sister        x8  . Lung cancer Maternal Uncle 48  . Stomach cancer Paternal Aunt        dx late 22s  . Cancer Paternal Uncle        unk type  . Pancreatic cancer Maternal Grandmother 80  . Lung cancer Maternal Grandfather   . Cancer Paternal Aunt        unk type   Amanda Carney has one adopted son, age 16. She has two sisters. One of her sister, age 28, has had 8 melanomas removed and has one daughter. Her other sister is 73 with no children.   Amanda Carney mother had ovarian cancer diagnosed at 69 and died at 34. She had two brothers. One had lung cancer and died at 31, he did have history of smoking. No cancers in cousins. Her maternal grandmother had pancreatic cancer diagnosed at 70 and died shortly after. Her maternal grandfather had lung cancer and died in his 50s.   Amanda Carney father died at 50. He had three sisters and a brother. One of his sisters had cancer, unknown type, and a brother also had cancer, unknown type, both have died. One of his sisters currently has stomach cancer and is in her late 5s. The other sister is cancer free and in her 72s. A paternal cousin had thyroid cancer. Her paternal grandmother died  in her 42s and possibly had cancer, paternal grandfather died in his mid 8s.  Amanda Carney is unaware of previous family history of genetic testing for hereditary cancer risks. Patient's ancestors are of Welsh/Irish/British descent. There is no reported Ashkenazi Jewish ancestry. There is no known consanguinity.  GENETIC TEST RESULTS: Genetic testing performed through [facility]'s Common Hereditary Cancers Panel reported out on 01/16/2018 showed no pathogenic mutations. This included sequencing and/or deletion/duplication testing of the following 51 genes: APC, ATM, AXIN2, BAP1, BARD1, BMPR1A, BRCA1, BRCA2, BRIP1, CDH1, CDK4, CDKN2A (p14ARF), CDKN2A (p16INK4a), CHEK2, CTNNA1, DICER1, EPCAM*, GREM1*, HOXB13, KIT, MEN1, MITF*, MLH1, MSH2, MSH3, MSH6, MUTYH, NBN, NF1, NTHL1, PALB2, PDGFRA, PMS2, POLD1, POLE, POT1, PTEN, RAD50, RAD51C, RAD51D, RB1, SDHA*, SDHB, SDHC, SDHD, SMAD4, SMARCA4, STK11, TP53, TSC1, TSC2, VHL  The test report will be scanned into EPIC and will be located under the Molecular Pathology section of the Results Review tab. A portion of the result report is included below for reference.     We discussed with Amanda Carney that because current genetic testing is not perfect, it is possible there may be a gene mutation in one of these genes that current testing cannot detect, but that chance is small.  We also discussed, that there could be another gene that has not yet been discovered, or that we have not yet tested, that is  responsible for the cancer diagnoses in the family. It is also possible there is a hereditary cause for the cancer in the family that Amanda Carney did not inherit and therefore was not identified in her testing.  Therefore, it is important to remain in touch with cancer genetics in the future so that we can continue to offer Amanda Carney the most up to date genetic testing.   ADDITIONAL GENETIC TESTING: We discussed with Amanda Carney that her genetic testing was fairly extensive.   If there are are genes identified to increase cancer risk that can be analyzed in the future, we would be happy to discuss and coordinate this testing at that time.    CANCER SCREENING RECOMMENDATIONS: Amanda Carney test result is considered negative (normal).  This means that we have not identified a hereditary cause for her personal and family history of cancer at this time.   This normal indicates that it is unlikely Amanda Carney has an increased risk of cancer due to a mutation in one of these genes.  While reassuring, this does not definitively rule out a hereditary predisposition to cancer. It is still possible that there could be genetic mutations that are undetectable by current technology, or genetic mutations in genes that have not been tested or identified to increase cancer risk.  Therefore, it is recommended she continue to follow the cancer management and screening guidelines provided by her oncology and primary healthcare provider. An individual's cancer risk is not determined by genetic test results alone.  Overall cancer risk assessment includes additional factors such as personal medical history, family history, etc.  These should be used to make a personalized plan for cancer prevention and surveillance.    RECOMMENDATIONS FOR FAMILY MEMBERS:  Relatives in this family might be at some increased risk of developing cancer, over the general population risk, simply due to the family history of cancer.  We recommended women in this family have a yearly mammogram beginning at age 71, or 3 years younger than the earliest onset of cancer, an annual clinical breast exam, and perform monthly breast self-exams. Women in this family should also have a gynecological exam as recommended by their primary provider. All family members should have a colonoscopy as directed by their doctors.  All family members should inform their physicians about the family history of cancer so their doctors can make the most  appropriate screening recommendations for them.   It is also possible there is a hereditary cause for the cancer in Amanda Carney's family that she did not inherit and therefore was not identified in her.  We recommended her sisters and maternal relatives have genetic counseling and testing. Amanda Carney will let us know if we can be of any assistance in coordinating genetic counseling and/or testing for these family members.   FOLLOW-UP: Lastly, we discussed with Amanda Carney that cancer genetics is a rapidly advancing field and it is possible that new genetic tests will be appropriate for her and/or her family members in the future. We encouraged her to remain in contact with cancer genetics on an annual basis so we can update her personal and family histories and let her know of advances in cancer genetics that may benefit this family.   Our contact number was provided. Amanda Carney's questions were answered to her satisfaction, and she knows she is welcome to call us at anytime with additional questions or concerns.  Faith Rogue, MS Genetic Counselor [email or web address] Phone: [phone]

## 2018-01-21 DIAGNOSIS — Z808 Family history of malignant neoplasm of other organs or systems: Secondary | ICD-10-CM | POA: Diagnosis not present

## 2018-01-21 DIAGNOSIS — L570 Actinic keratosis: Secondary | ICD-10-CM | POA: Diagnosis not present

## 2018-01-21 DIAGNOSIS — Z85828 Personal history of other malignant neoplasm of skin: Secondary | ICD-10-CM | POA: Diagnosis not present

## 2018-01-21 DIAGNOSIS — D225 Melanocytic nevi of trunk: Secondary | ICD-10-CM | POA: Diagnosis not present

## 2018-03-11 DIAGNOSIS — Z0000 Encounter for general adult medical examination without abnormal findings: Secondary | ICD-10-CM | POA: Diagnosis not present

## 2018-03-11 DIAGNOSIS — E789 Disorder of lipoprotein metabolism, unspecified: Secondary | ICD-10-CM | POA: Diagnosis not present

## 2018-03-18 DIAGNOSIS — M150 Primary generalized (osteo)arthritis: Secondary | ICD-10-CM | POA: Diagnosis not present

## 2018-03-18 DIAGNOSIS — E782 Mixed hyperlipidemia: Secondary | ICD-10-CM | POA: Diagnosis not present

## 2018-03-18 DIAGNOSIS — Z0000 Encounter for general adult medical examination without abnormal findings: Secondary | ICD-10-CM | POA: Diagnosis not present

## 2018-04-15 DIAGNOSIS — H00015 Hordeolum externum left lower eyelid: Secondary | ICD-10-CM | POA: Diagnosis not present

## 2018-04-15 DIAGNOSIS — L309 Dermatitis, unspecified: Secondary | ICD-10-CM | POA: Diagnosis not present

## 2018-04-15 DIAGNOSIS — D2272 Melanocytic nevi of left lower limb, including hip: Secondary | ICD-10-CM | POA: Diagnosis not present

## 2018-07-30 ENCOUNTER — Other Ambulatory Visit: Payer: Self-pay

## 2018-07-30 DIAGNOSIS — Z20822 Contact with and (suspected) exposure to covid-19: Secondary | ICD-10-CM

## 2018-08-03 LAB — NOVEL CORONAVIRUS, NAA: SARS-CoV-2, NAA: NOT DETECTED

## 2018-08-14 ENCOUNTER — Other Ambulatory Visit: Payer: Self-pay | Admitting: Otolaryngology

## 2018-08-17 ENCOUNTER — Other Ambulatory Visit: Payer: Self-pay | Admitting: Otolaryngology

## 2018-08-17 DIAGNOSIS — H9122 Sudden idiopathic hearing loss, left ear: Secondary | ICD-10-CM

## 2018-09-08 ENCOUNTER — Ambulatory Visit
Admission: RE | Admit: 2018-09-08 | Discharge: 2018-09-08 | Disposition: A | Discharge status: Home / Self Care | Payer: 59 | Source: Ambulatory Visit | Attending: Otolaryngology | Admitting: Otolaryngology

## 2018-09-08 ENCOUNTER — Other Ambulatory Visit: Payer: Self-pay

## 2018-09-08 DIAGNOSIS — H9122 Sudden idiopathic hearing loss, left ear: Secondary | ICD-10-CM

## 2018-09-08 MED ORDER — GADOBENATE DIMEGLUMINE 529 MG/ML IV SOLN
13.0000 mL | Freq: Once | INTRAVENOUS | Status: AC | PRN
  Administered 2018-09-08: 13 mL via INTRAVENOUS

## 2018-09-12 ENCOUNTER — Other Ambulatory Visit: Payer: 59

## 2018-09-17 ENCOUNTER — Other Ambulatory Visit: Payer: Self-pay

## 2018-09-17 ENCOUNTER — Ambulatory Visit (INDEPENDENT_AMBULATORY_CARE_PROVIDER_SITE_OTHER): Payer: 59 | Admitting: Cardiology

## 2018-09-17 ENCOUNTER — Encounter: Payer: Self-pay | Admitting: Cardiology

## 2018-09-17 VITALS — BP 142/83 | HR 61 | Ht 62.0 in | Wt 136.3 lb

## 2018-09-17 DIAGNOSIS — I10 Essential (primary) hypertension: Secondary | ICD-10-CM

## 2018-09-17 DIAGNOSIS — Z01818 Encounter for other preprocedural examination: Secondary | ICD-10-CM

## 2018-09-17 NOTE — Progress Notes (Signed)
Patient referred by Merrilee Seashore, MD for pre-op evaluation, hypertension  Subjective:   Amanda Carney, female    DOB: [birth year], 58 y.o.   MRN: [id number]   Chief Complaint  Patient presents with  . Abnormal ECG  . New Patient (Initial Visit)    pre-op clearance for L knee replacement     HPI  58 y.o. Caucasian female with hypertension, h/o Grave's disease. Patient is scheduled to undergo left knee surgery on 9/22. She was referred for cardiac risk stratification given abnormal EKG.  Patient is very active and plays tennis, works out regularly, without any chest pain, or unusual shortness of breath symptoms. She was found to have blood pressure up to 190/90 mmHg, before being started on amlodipine by her PCP a few months ago. Blood pressure is better controlled now.   She has noted that her blood pressure monitor has noted "irregular heart rhythm" a few times in the last few weeks.   Past Medical History:  Diagnosis Date  . Family history of lung cancer   . Family history of melanoma   . Family history of ovarian cancer   . Family history of pancreatic cancer   . Family history of stomach cancer   . Hyperlipidemia   . Personal history of melanoma in-situ      Past Surgical History:  Procedure Laterality Date  . ABDOMINAL HYSTERECTOMY    . LAPAROSCOPIC SALPINGO OOPHERECTOMY       Social History   Socioeconomic History  . Marital status: Married    Spouse name: Not on file  . Number of children: Not on file  . Years of education: Not on file  . Highest education level: Not on file  Occupational History  . Not on file  Social Needs  . Financial resource strain: Not on file  . Food insecurity    Worry: Not on file    Inability: Not on file  . Transportation needs    Medical: Not on file    Non-medical: Not on file  Tobacco Use  . Smoking status: Never Smoker  . Smokeless tobacco: Never Used  Substance and Sexual Activity  . Alcohol use: Not on  file  . Drug use: Not on file  . Sexual activity: Not on file  Lifestyle  . Physical activity    Days per week: Not on file    Minutes per session: Not on file  . Stress: Not on file  Relationships  . Social Herbalist on phone: Not on file    Gets together: Not on file    Attends religious service: Not on file    Active member of club or organization: Not on file    Attends meetings of clubs or organizations: Not on file    Relationship status: Not on file  . Intimate partner violence    Fear of current or ex partner: Not on file    Emotionally abused: Not on file    Physically abused: Not on file    Forced sexual activity: Not on file  Other Topics Concern  . Not on file  Social History Narrative  . Not on file     Family History  Problem Relation Age of Onset  . Osteoporosis Mother   . Ovarian cancer Mother 59  . Melanoma Sister        x8  . Lung cancer Maternal Uncle 43  . Stomach cancer Paternal Aunt  dx late 20s  . Cancer Paternal Uncle        unk type  . Pancreatic cancer Maternal Grandmother 80  . Lung cancer Maternal Grandfather   . Cancer Paternal Aunt        unk type     Current Outpatient Medications on File Prior to Visit  Medication Sig Dispense Refill  . glucosamine-chondroitin 500-400 MG tablet Take 1 tablet by mouth 3 (three) times daily.    . Misc Natural Products (TART CHERRY ADVANCED PO) Take by mouth.    Marland Kitchen PRAVASTATIN SODIUM PO Take by mouth.    . Turmeric 500 MG CAPS Take 500 mg by mouth.    . Vitamin D, Ergocalciferol, (DRISDOL) 50000 units CAPS capsule Take 1 capsule (50,000 Units total) by mouth every 7 (seven) days. 12 capsule 0   No current facility-administered medications on file prior to visit.     Cardiovascular studies:  EKG 09/09/2018: Sinus rhythm 69 bpm. Nonspecific T wave inversion inferolateral leads.   Recent labs: 09/09/2018: H/HG 13/39. MCV 89. Platelets 292.    Review of Systems  Constitution:  Negative for decreased appetite, malaise/fatigue, weight gain and weight loss.  HENT: Negative for congestion.   Eyes: Negative for visual disturbance.  Cardiovascular: Negative for chest pain, dyspnea on exertion, leg swelling, palpitations and syncope.  Respiratory: Negative for cough.   Endocrine: Negative for cold intolerance.  Hematologic/Lymphatic: Does not bruise/bleed easily.  Skin: Negative for itching and rash.  Musculoskeletal: Negative for myalgias.  Gastrointestinal: Negative for abdominal pain, nausea and vomiting.  Genitourinary: Negative for dysuria.  Neurological: Negative for dizziness and weakness.  Psychiatric/Behavioral: The patient is not nervous/anxious.   All other systems reviewed and are negative.        Vitals:   09/17/18 1257  BP: (!) 142/83  Pulse: 61  SpO2: 96%    Body mass index is 24.93 kg/m. Filed Weights   09/17/18 1257  Weight: 61.8 kg     Objective:   Physical Exam  Constitutional: She is oriented to person, place, and time. She appears well-developed and well-nourished. No distress.  HENT:  Head: Normocephalic and atraumatic.  Eyes: Pupils are equal, round, and reactive to light. Conjunctivae are normal.  Neck: No JVD present.  Cardiovascular: Normal rate, regular rhythm and intact distal pulses.  No murmur heard. Pulmonary/Chest: Effort normal and breath sounds normal. She has no wheezes. She has no rales.  Abdominal: Soft. Bowel sounds are normal. There is no rebound.  Musculoskeletal:        General: No edema.  Lymphadenopathy:    She has no cervical adenopathy.  Neurological: She is alert and oriented to person, place, and time. No cranial nerve deficit.  Skin: Skin is warm and dry.  Psychiatric: She has a normal mood and affect.  Nursing note and vitals reviewed.         Assessment & Recommendations:   58 y.o. Caucasian female with hypertension, h/o Grave's disease, pre-op risk stratification prior to knee surgery   Pre-op risk stratification: Excellent baseline functional capacity. EKG likely shows hypertensive changes. Will obtain echocardiogram. Continue current antihypertensive therapy. Will need to monitor if the irregular rhythm is Afib. Patient is going to wear Kardia monitor and let us know if recurrent irregular rhythm episodes occur- in which case she may need an event monitor. Nonetheless, she is in regular rhythm. I do not foresee this becoming an issue prior to her upcoming surgery. AS long as echocardiogram does not show any significant  abnormalities, kay to proceed with knee surgery with low cardiac risk.     Thank you for referring the patient to [place]. Please feel free to contact with any questions.  Nigel Mormon, MD [facility] [place] Pager: [phone] Office: [phone] If no answer Cell [phone]

## 2018-09-21 ENCOUNTER — Ambulatory Visit (INDEPENDENT_AMBULATORY_CARE_PROVIDER_SITE_OTHER): Payer: 59

## 2018-09-21 ENCOUNTER — Other Ambulatory Visit: Payer: Self-pay

## 2018-09-21 DIAGNOSIS — I10 Essential (primary) hypertension: Secondary | ICD-10-CM

## 2018-09-23 NOTE — Progress Notes (Signed)
Called pt to inform her about her echo results. Pt understood.

## 2019-03-18 ENCOUNTER — Ambulatory Visit: Payer: 59 | Admitting: Cardiology

## 2019-03-31 ENCOUNTER — Other Ambulatory Visit: Payer: Self-pay

## 2019-03-31 ENCOUNTER — Telehealth: Payer: 59 | Admitting: Cardiology

## 2019-03-31 VITALS — BP 133/75

## 2019-03-31 DIAGNOSIS — I499 Cardiac arrhythmia, unspecified: Secondary | ICD-10-CM | POA: Insufficient documentation

## 2019-03-31 DIAGNOSIS — I10 Essential (primary) hypertension: Secondary | ICD-10-CM

## 2019-03-31 NOTE — Progress Notes (Signed)
    Patient referred by Merrilee Seashore, MD for pre-op evaluation, hypertension  Subjective:   Amanda Carney, female    DOB: Apr 24, 1960, 59 y.o.   MRN: [id number]   Chief Complaint  Patient presents with  . Irregular Heart Beat    HPI  59 y.o. Caucasian female with hypertension, h/o Grave's disease.   I saw the patient for pre-op evaluation 09/2018 prior to left and right knee surgery. Echocardiogram was unremarkable.   Patient is recovering from her most recent knee surgery in January 2021. She is working with physicla therapy. Blood pressure has been in BP 130s-140s/70s-80s. She has noticed "irregular heart beat" on Omron BP cuff on 3/12 and 1/13. When checked on Cardia monitoring device, it was found to be normal.    Current Outpatient Medications on File Prior to Visit  Medication Sig Dispense Refill  . amLODipine (NORVASC) 5 MG tablet Take 5 mg by mouth daily.    Marland Kitchen aspirin EC 81 MG tablet Take 81 mg by mouth daily.    . Estradiol (IMVEXXY MAINTENANCE PACK VA) Place vaginally. Twice weekly    . fexofenadine (ALLEGRA) 180 MG tablet Take 180 mg by mouth daily.    . fluticasone (FLONASE) 50 MCG/ACT nasal spray Place into both nostrils daily.    . Lactobacillus-Inulin ([place]) Take by mouth daily.    . Pitavastatin Calcium (LIVALO) 4 MG TABS Take by mouth. 1/2 tab twice a week     No current facility-administered medications on file prior to visit.    Cardiovascular studies:  Echocardiogram 09/21/2018:  Left ventricle cavity is normal in size. Normal left ventricular wall  thickness. Normal LV systolic function with EF 64%. Normal global wall  motion. Normal diastolic filling pattern.  Mild (Grade I) mitral regurgitation.  Inadequate TR jet to estimate pulmonary artery systolic pressure. Normal  right atrial pressure.   EKG 09/09/2018: Sinus rhythm 69 bpm. Nonspecific T wave inversion inferolateral leads.   Recent labs: 03/11/2018: Glucose  68, BUN/Cr 11/0.9. EGFR 70 Chol 216, TG 109, HDL 118, LDL 76 TSH 3.9 normal    Review of Systems  Cardiovascular: Negative for chest pain, dyspnea on exertion, leg swelling, palpitations and syncope.       Vitals:   03/30/19 1422 03/31/19 1421  BP: (!) 147/84 133/75      Objective:   Physical Exam  Constitutional: She is oriented to person, place, and time. She appears well-developed and well-nourished. No distress.  Pulmonary/Chest: Effort normal.  Neurological: She is alert and oriented to person, place, and time.  Psychiatric: She has a normal mood and affect.  Nursing note and vitals reviewed.         Assessment & Recommendations:   59 y.o. Caucasian female with hypertension, h/o Grave's disease  Hypertension: Suboptimal control. May be affected by pain post knee surgery. Continue f/u w/PCP.   Irregular heart beat: Noted twice on blood pressure cuff. Could be asymptomatic Afib. Recommend 14 day ROCT.  Do not see any indication for Aspirin for primary prevention.   Further recommendations after above testing. I will see her on as needed basis.  Nigel Mormon, MD [facility] Pager: [phone] Office: [phone] If no answer Cell [phone]

## 2019-04-02 ENCOUNTER — Ambulatory Visit: Payer: 59

## 2019-04-02 ENCOUNTER — Other Ambulatory Visit: Payer: Self-pay

## 2019-04-02 DIAGNOSIS — I499 Cardiac arrhythmia, unspecified: Secondary | ICD-10-CM

## 2019-05-12 ENCOUNTER — Telehealth: Payer: Self-pay

## 2019-05-12 NOTE — Telephone Encounter (Signed)
Patient called requesting her monitor results. Please advise.

## 2019-05-13 NOTE — Telephone Encounter (Signed)
Real time outpatient cardiac telemetry- 14 days 04/02/2019-04/16/2019:  She is having occasional PAC. And rare PVC.  PAC = Premature atrial complexes: These arise from the upper chamber of the heart.  These are very common and are not dangerous.  Extra skipped beat coming from the upper chamber (atrium) and mostly are life altering (nuisance) than life threatening and mostly treated by reassurance.  Essentially means this:  There may not be any specific reasons for this, however patients with excessive caffeine, anxiety, lack of sleep, alcohol or thyroid problems can have these episodes.   Premature ventricular complexes (PVC) means extra heartbeat from the lower chamber of the heart.  These are very common and are not dangerous.  Extra skipped beat coming from the bottom chamber (ventricle) and mostly are life altering (nuisance) than life threatening and mostly treated by reassurance.  There may not be any specific reasons for this, however patients with excessive caffeine, anxiety, lack of sleep, alcohol or thyroid problems can have these episodes.  Rarely patients with block coronary arteries or family history of heart muscle disease may be the reason.  If more questions, he can discuss with her doctor on office visit.  JG

## 2019-05-17 NOTE — Telephone Encounter (Signed)
Called and spoke with patient regarding her monitor results.

## 2019-05-20 NOTE — Progress Notes (Signed)
Called and spoke with patient regarding her monitor results.

## 2020-10-27 IMAGING — MR MR BRAIN/IAC WO/W CM
13 of 14 series · 43 of 48 positions shown · IV contrast (MULTIHANCE)
Comparison: No pertinent prior studies available for comparison.

CLINICAL DATA: Sudden hearing loss, left. Additional history: Ear
pressure with sudden loss of hearing in the left ear, worsening over
the past 3-4 months. History of cervical cancer and melanoma.

Creatinine was obtained on site at [HOSPITAL] at [HOSPITAL].
Results: Creatinine 0.8 mg/dL (GFR 80).
EXAM:
MRI HEAD WITHOUT AND WITH CONTRAST
TECHNIQUE: Multiplanar, multiecho pulse sequences of the brain and surrounding
structures were obtained without and with intravenous contrast. An
IAC protocol was utilized.
CONTRAST:  13mL MULTIHANCE GADOBENATE DIMEGLUMINE 529 MG/ML IV SOLN

[Series 5: T1 · sagittal · 4.0mm · 0.90mm/px · 2 of 28 slices shown (1 of 3)]
[im 1/28]
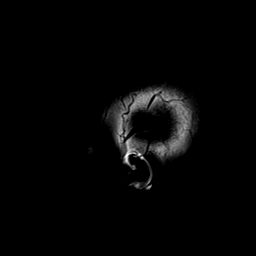
[im 28/28]
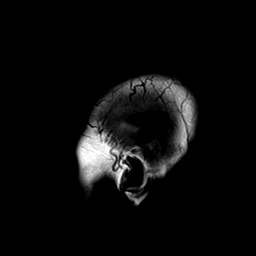

[Series 6: DWI · axial · 3.0mm · 1.44mm/px · z∈[-92,+75]mm · 6 of 88 slices shown (1 of 2)]
[im 1/88]
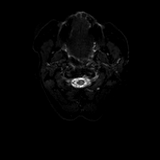
[im 18/88]
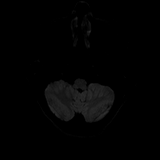
[im 35/88]
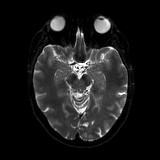
[im 53/88]
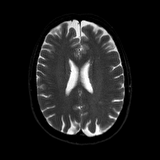
[im 70/88]
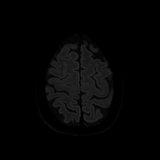
[im 88/88]
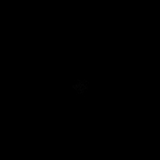

[Series 7: DWI · axial · 3.0mm · 1.44mm/px · z∈[-92,+75]mm · 3 of 44 slices shown (2 of 2)]
[im 1/44]
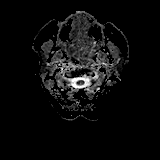
[im 22/44]
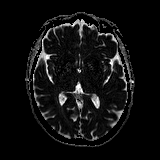
[im 44/44]
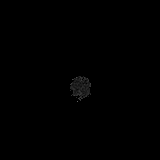

[Series 8: T2 · axial · 4.0mm · 0.36mm/px · z∈[-81,+64]mm · 2 of 29 slices shown]
[im 1/29]
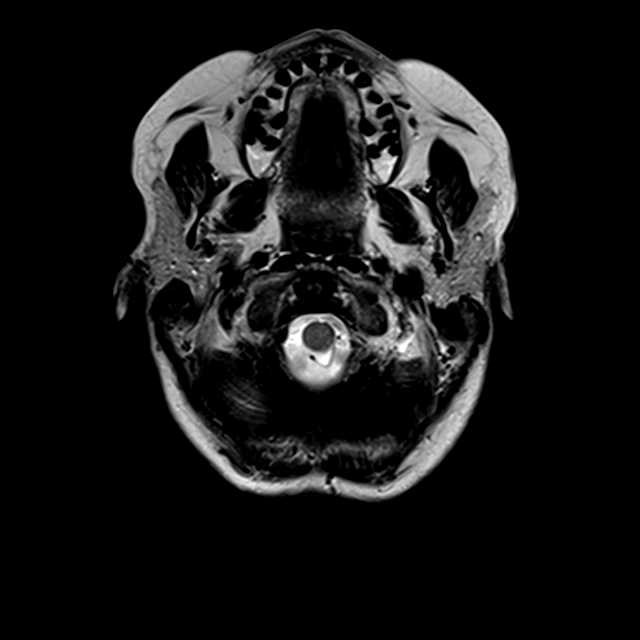
[im 29/29]
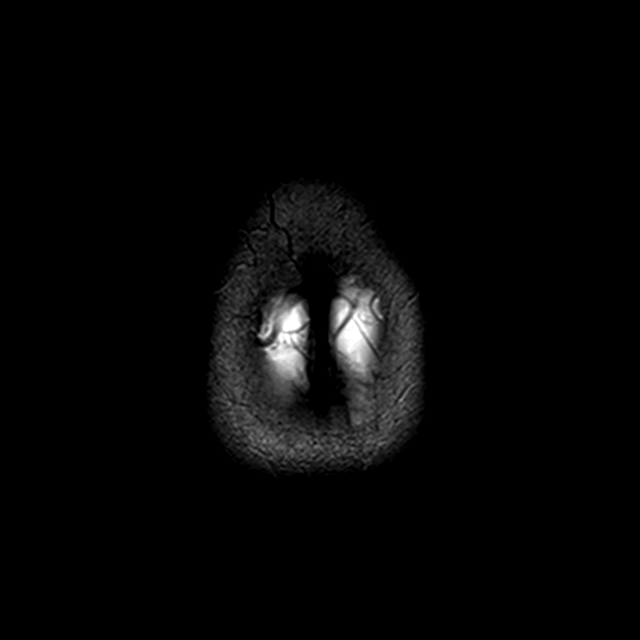

[Series 9: FLAIR · axial · 3.0mm · 0.72mm/px · z∈[-89,+72]mm · 2 of 28 slices shown]
[im 1/28]
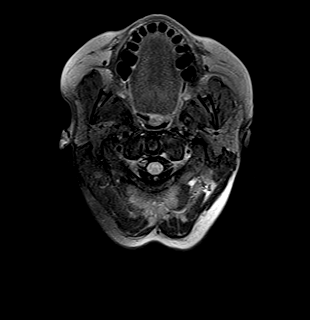
[im 28/28]
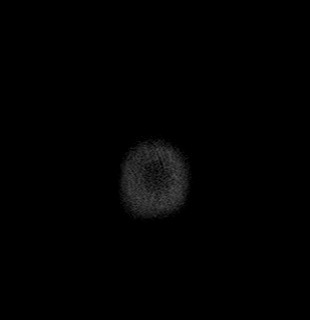

[Series 11: swi_images · axial · 1.5mm · 0.90mm/px · z∈[-82,+60]mm · 7 of 96 slices shown]
[im 1/96]
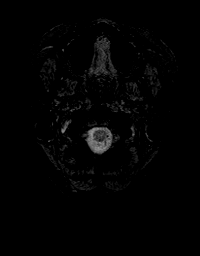
[im 16/96]
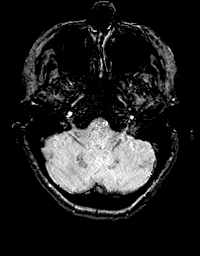
[im 32/96]
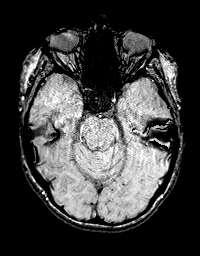
[im 48/96]
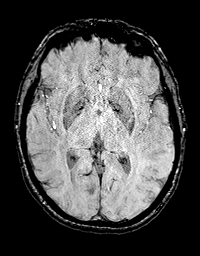
[im 64/96]
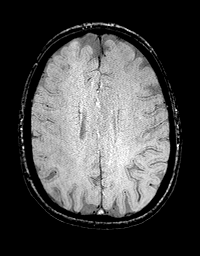
[im 80/96]
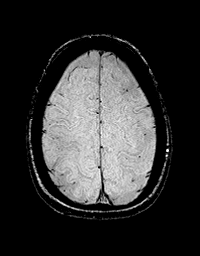
[im 96/96]
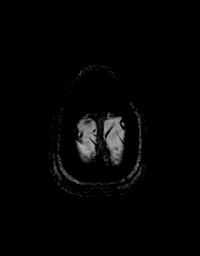

[Series 12: T1 · coronal · 2.5mm · 0.56mm/px · 1 of 13 slices shown (2 of 3)]
[im 1/13]
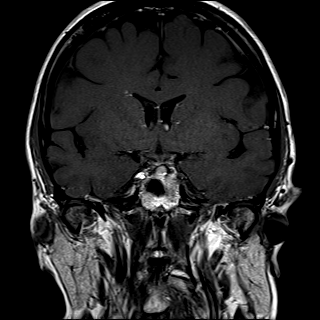

[Series 13: T1 · axial · 2.5mm · 0.50mm/px · 1 of 13 slices shown (3 of 3)]
[im 1/13]
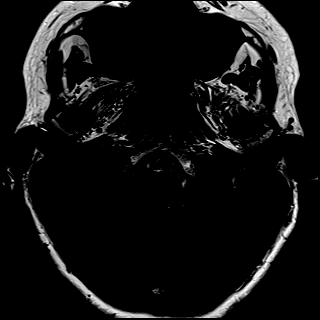

[Series 15: T1 post-contrast · coronal · 2.5mm · 0.56mm/px · 1 of 13 slices shown (1 of 4)]
[im 1/13]
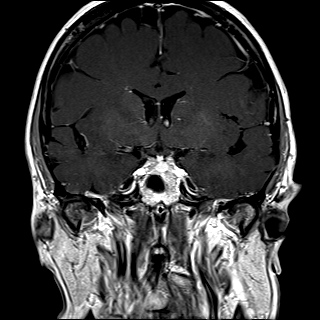

[Series 16: T1 post-contrast · axial · 2.5mm · 0.50mm/px · 1 of 13 slices shown (2 of 4)]
[im 1/13]
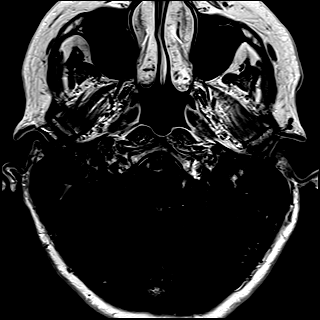

[Series 17: T1 post-contrast · axial · 1.0mm · 0.90mm/px · z∈[-88,+70]mm · 12 of 160 slices shown (3 of 4)]
[im 1/160]
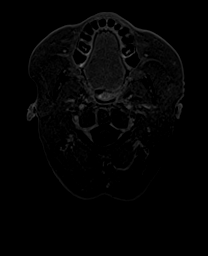
[im 15/160]
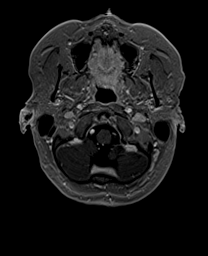
[im 29/160]
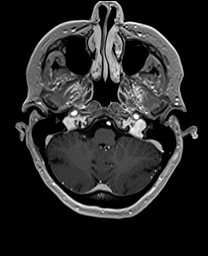
[im 44/160]
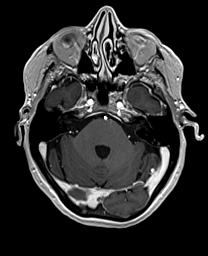
[im 58/160]
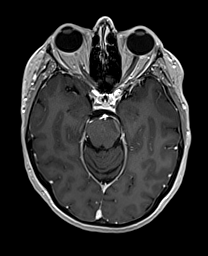
[im 73/160]
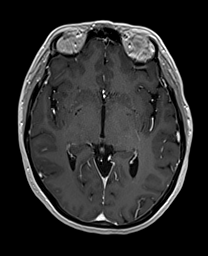
[im 87/160]
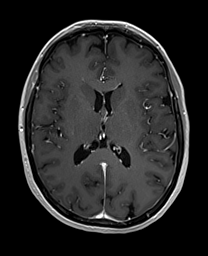
[im 102/160]
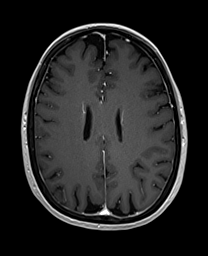
[im 116/160]
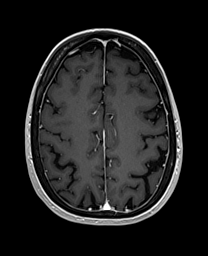
[im 131/160]
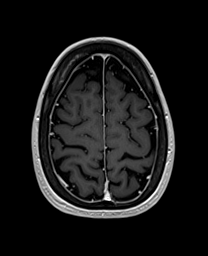
[im 145/160]
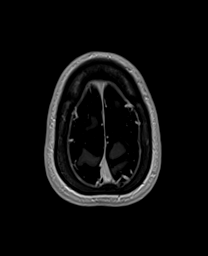
[im 160/160]
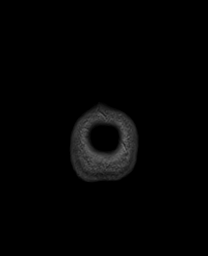

[Series 18: T1 post-contrast · coronal · 4.0mm · 0.72mm/px · 2 of 31 slices shown (4 of 4)]
[im 1/31]
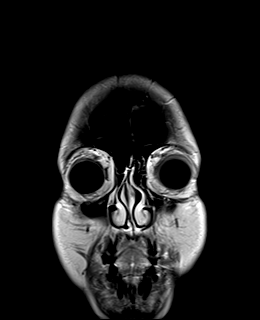
[im 31/31]
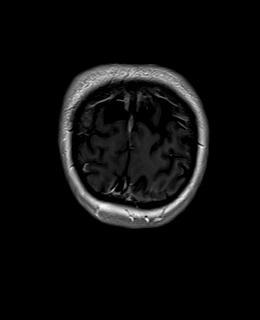

[Series 19: T2 post-contrast · coronal · 4.0mm · 0.36mm/px · 3 of 35 slices shown]
[im 1/35]
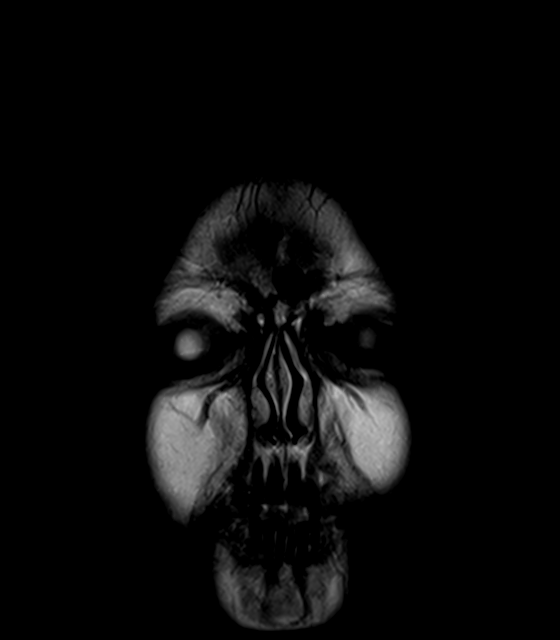
[im 18/35]
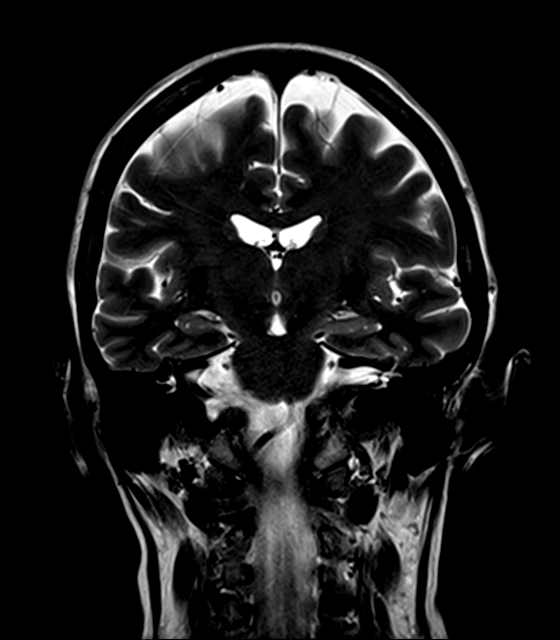
[im 35/35]
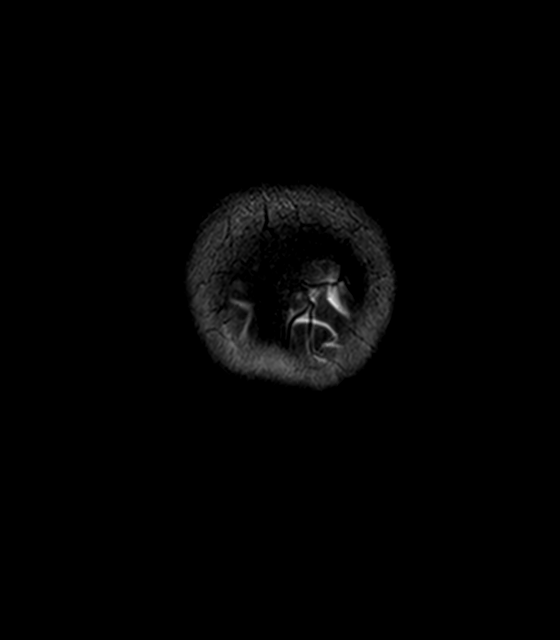

[43 of 48 positions shown; findings below may reference images not displayed]

FINDINGS: Brain:

No evidence of acute infarct.

No evidence of intracranial mass. Specifically, no cerebellopontine
angle mass or internal auditory canal lesion is demonstrated.

No chronic intracranial hemorrhage.

No midline shift or extra-axial fluid collection.

Mild scattered T2/FLAIR hyperintensity within the cerebral white
matter is nonspecific, but consistent with chronic small vessel
ischemic disease. Vague T1 hyperintensity within the bilateral basal
ganglia is nonspecific, but may likely reflects mild mineralization.

Parenchymal volume is age-appropriate.

No abnormal intracranial enhancement.

Vascular: Flow voids maintained within the proximal large arterial
vessels. The distal right vertebral artery appears significantly
dominant.

Skull and upper cervical spine: Normal marrow signal.

Sinuses/Orbits: Imaged orbits demonstrate no acute abnormality.
Trace ethmoid sinus mucosal thickening. No significant mastoid
effusion.
IMPRESSION: No evidence of intracranial mass or acute intracranial abnormality.

Specifically, no cerebellopontine angle mass or internal auditory
canal lesion is demonstrated.

Mild chronic small vessel ischemic disease.

## 2022-02-27 ENCOUNTER — Other Ambulatory Visit (HOSPITAL_BASED_OUTPATIENT_CLINIC_OR_DEPARTMENT_OTHER): Payer: Self-pay | Admitting: Internal Medicine

## 2022-02-27 DIAGNOSIS — E782 Mixed hyperlipidemia: Secondary | ICD-10-CM

## 2022-03-04 ENCOUNTER — Ambulatory Visit (HOSPITAL_BASED_OUTPATIENT_CLINIC_OR_DEPARTMENT_OTHER)
Admission: RE | Admit: 2022-03-04 | Discharge: 2022-03-04 | Disposition: A | Discharge status: Home / Self Care | Payer: 59 | Source: Ambulatory Visit | Attending: Internal Medicine | Admitting: Internal Medicine

## 2022-03-04 DIAGNOSIS — E782 Mixed hyperlipidemia: Secondary | ICD-10-CM

## 2023-02-21 DIAGNOSIS — R197 Diarrhea, unspecified: Secondary | ICD-10-CM | POA: Diagnosis not present

## 2023-04-14 DIAGNOSIS — H52223 Regular astigmatism, bilateral: Secondary | ICD-10-CM | POA: Diagnosis not present

## 2023-06-30 DIAGNOSIS — L814 Other melanin hyperpigmentation: Secondary | ICD-10-CM | POA: Diagnosis not present

## 2023-06-30 DIAGNOSIS — L578 Other skin changes due to chronic exposure to nonionizing radiation: Secondary | ICD-10-CM | POA: Diagnosis not present

## 2023-06-30 DIAGNOSIS — D485 Neoplasm of uncertain behavior of skin: Secondary | ICD-10-CM | POA: Diagnosis not present

## 2023-06-30 DIAGNOSIS — Z85820 Personal history of malignant melanoma of skin: Secondary | ICD-10-CM | POA: Diagnosis not present

## 2023-06-30 DIAGNOSIS — Z85828 Personal history of other malignant neoplasm of skin: Secondary | ICD-10-CM | POA: Diagnosis not present

## 2023-06-30 DIAGNOSIS — L570 Actinic keratosis: Secondary | ICD-10-CM | POA: Diagnosis not present

## 2023-07-01 DIAGNOSIS — K2100 Gastro-esophageal reflux disease with esophagitis, without bleeding: Secondary | ICD-10-CM | POA: Diagnosis not present

## 2023-07-01 DIAGNOSIS — Z0000 Encounter for general adult medical examination without abnormal findings: Secondary | ICD-10-CM | POA: Diagnosis not present

## 2023-07-01 DIAGNOSIS — M064 Inflammatory polyarthropathy: Secondary | ICD-10-CM | POA: Diagnosis not present

## 2023-07-01 DIAGNOSIS — I10 Essential (primary) hypertension: Secondary | ICD-10-CM | POA: Diagnosis not present

## 2023-07-01 DIAGNOSIS — E782 Mixed hyperlipidemia: Secondary | ICD-10-CM | POA: Diagnosis not present

## 2023-07-08 DIAGNOSIS — Z23 Encounter for immunization: Secondary | ICD-10-CM | POA: Diagnosis not present

## 2023-07-08 DIAGNOSIS — N182 Chronic kidney disease, stage 2 (mild): Secondary | ICD-10-CM | POA: Diagnosis not present

## 2023-07-08 DIAGNOSIS — E782 Mixed hyperlipidemia: Secondary | ICD-10-CM | POA: Diagnosis not present

## 2023-07-08 DIAGNOSIS — Z0000 Encounter for general adult medical examination without abnormal findings: Secondary | ICD-10-CM | POA: Diagnosis not present

## 2023-07-08 DIAGNOSIS — I10 Essential (primary) hypertension: Secondary | ICD-10-CM | POA: Diagnosis not present

## 2023-07-10 DIAGNOSIS — Z8541 Personal history of malignant neoplasm of cervix uteri: Secondary | ICD-10-CM | POA: Diagnosis not present

## 2023-07-10 DIAGNOSIS — N952 Postmenopausal atrophic vaginitis: Secondary | ICD-10-CM | POA: Diagnosis not present

## 2023-07-10 DIAGNOSIS — N941 Unspecified dyspareunia: Secondary | ICD-10-CM | POA: Diagnosis not present

## 2023-07-10 DIAGNOSIS — Z01419 Encounter for gynecological examination (general) (routine) without abnormal findings: Secondary | ICD-10-CM | POA: Diagnosis not present

## 2023-08-06 DIAGNOSIS — Z1231 Encounter for screening mammogram for malignant neoplasm of breast: Secondary | ICD-10-CM | POA: Diagnosis not present

## 2023-08-26 DIAGNOSIS — R519 Headache, unspecified: Secondary | ICD-10-CM | POA: Diagnosis not present

## 2023-08-26 DIAGNOSIS — H9202 Otalgia, left ear: Secondary | ICD-10-CM | POA: Diagnosis not present

## 2023-08-27 DIAGNOSIS — Z780 Asymptomatic menopausal state: Secondary | ICD-10-CM | POA: Diagnosis not present

## 2023-11-12 DIAGNOSIS — L570 Actinic keratosis: Secondary | ICD-10-CM | POA: Diagnosis not present

## 2023-11-12 DIAGNOSIS — Z85828 Personal history of other malignant neoplasm of skin: Secondary | ICD-10-CM | POA: Diagnosis not present

## 2023-11-12 DIAGNOSIS — Z85820 Personal history of malignant melanoma of skin: Secondary | ICD-10-CM | POA: Diagnosis not present

## 2023-11-12 DIAGNOSIS — L578 Other skin changes due to chronic exposure to nonionizing radiation: Secondary | ICD-10-CM | POA: Diagnosis not present

## 2023-11-12 DIAGNOSIS — L814 Other melanin hyperpigmentation: Secondary | ICD-10-CM | POA: Diagnosis not present
